# Patient Record
Sex: Male | Born: 1955 | Race: Black or African American | Hispanic: No | Marital: Single | Smoking: Current every day smoker
Health system: Southern US, Community
[De-identification: ages and names within clinical notes are randomized; demographics above are authoritative.]

## PROBLEM LIST (undated history)

## (undated) DIAGNOSIS — R200 Anesthesia of skin: Secondary | ICD-10-CM

## (undated) DIAGNOSIS — K5790 Diverticulosis of intestine, part unspecified, without perforation or abscess without bleeding: Secondary | ICD-10-CM

## (undated) DIAGNOSIS — M1990 Unspecified osteoarthritis, unspecified site: Secondary | ICD-10-CM

## (undated) DIAGNOSIS — G8929 Other chronic pain: Secondary | ICD-10-CM

## (undated) DIAGNOSIS — M62838 Other muscle spasm: Secondary | ICD-10-CM

## (undated) DIAGNOSIS — M542 Cervicalgia: Secondary | ICD-10-CM

## (undated) DIAGNOSIS — Z86010 Personal history of colonic polyps: Principal | ICD-10-CM

## (undated) DIAGNOSIS — Z8709 Personal history of other diseases of the respiratory system: Secondary | ICD-10-CM

## (undated) DIAGNOSIS — T7840XA Allergy, unspecified, initial encounter: Secondary | ICD-10-CM

## (undated) DIAGNOSIS — IMO0001 Reserved for inherently not codable concepts without codable children: Secondary | ICD-10-CM

## (undated) DIAGNOSIS — J449 Chronic obstructive pulmonary disease, unspecified: Secondary | ICD-10-CM

## (undated) DIAGNOSIS — W3400XA Accidental discharge from unspecified firearms or gun, initial encounter: Secondary | ICD-10-CM

## (undated) HISTORY — DX: Anesthesia of skin: R20.0

## (undated) HISTORY — DX: Unspecified osteoarthritis, unspecified site: M19.90

## (undated) HISTORY — DX: Allergy, unspecified, initial encounter: T78.40XA

## (undated) HISTORY — PX: TOENAIL EXCISION: SHX183

## (undated) HISTORY — PX: COLONOSCOPY: SHX174

## (undated) HISTORY — DX: Other muscle spasm: M62.838

## (undated) HISTORY — DX: Personal history of colonic polyps: Z86.010

---

## 2004-09-15 ENCOUNTER — Emergency Department (HOSPITAL_COMMUNITY): Admission: EM | Admit: 2004-09-15 | Discharge: 2004-09-15 | Payer: Self-pay | Admitting: Emergency Medicine

## 2004-09-17 ENCOUNTER — Ambulatory Visit: Payer: Self-pay | Admitting: Internal Medicine

## 2004-09-26 ENCOUNTER — Ambulatory Visit (HOSPITAL_COMMUNITY): Admission: RE | Admit: 2004-09-26 | Discharge: 2004-09-26 | Payer: Self-pay | Admitting: Internal Medicine

## 2004-09-26 ENCOUNTER — Ambulatory Visit: Payer: Self-pay | Admitting: Internal Medicine

## 2005-11-07 ENCOUNTER — Emergency Department (HOSPITAL_COMMUNITY): Admission: EM | Admit: 2005-11-07 | Discharge: 2005-11-08 | Payer: Self-pay | Admitting: Emergency Medicine

## 2005-11-24 ENCOUNTER — Ambulatory Visit: Payer: Self-pay | Admitting: *Deleted

## 2005-11-24 ENCOUNTER — Ambulatory Visit: Payer: Self-pay | Admitting: Family Medicine

## 2005-11-30 ENCOUNTER — Ambulatory Visit (HOSPITAL_COMMUNITY): Admission: RE | Admit: 2005-11-30 | Discharge: 2005-11-30 | Payer: Self-pay | Admitting: Internal Medicine

## 2005-12-02 ENCOUNTER — Ambulatory Visit: Payer: Self-pay | Admitting: Family Medicine

## 2005-12-03 ENCOUNTER — Ambulatory Visit (HOSPITAL_COMMUNITY): Admission: RE | Admit: 2005-12-03 | Discharge: 2005-12-03 | Payer: Self-pay | Admitting: Internal Medicine

## 2006-03-05 ENCOUNTER — Ambulatory Visit: Payer: Self-pay | Admitting: Family Medicine

## 2011-03-13 ENCOUNTER — Observation Stay (HOSPITAL_COMMUNITY)
Admission: EM | Admit: 2011-03-13 | Discharge: 2011-03-14 | Discharge status: Against Medical Advice | Payer: Self-pay | Attending: Internal Medicine | Admitting: Internal Medicine

## 2011-03-13 ENCOUNTER — Emergency Department (HOSPITAL_COMMUNITY): Payer: Self-pay

## 2011-03-13 DIAGNOSIS — F1010 Alcohol abuse, uncomplicated: Secondary | ICD-10-CM | POA: Insufficient documentation

## 2011-03-13 DIAGNOSIS — G8929 Other chronic pain: Secondary | ICD-10-CM | POA: Insufficient documentation

## 2011-03-13 DIAGNOSIS — F17200 Nicotine dependence, unspecified, uncomplicated: Secondary | ICD-10-CM | POA: Insufficient documentation

## 2011-03-13 DIAGNOSIS — R0602 Shortness of breath: Secondary | ICD-10-CM | POA: Insufficient documentation

## 2011-03-13 DIAGNOSIS — R079 Chest pain, unspecified: Secondary | ICD-10-CM | POA: Insufficient documentation

## 2011-03-13 DIAGNOSIS — M549 Dorsalgia, unspecified: Secondary | ICD-10-CM | POA: Insufficient documentation

## 2011-03-13 DIAGNOSIS — H538 Other visual disturbances: Secondary | ICD-10-CM | POA: Insufficient documentation

## 2011-03-13 DIAGNOSIS — Z8249 Family history of ischemic heart disease and other diseases of the circulatory system: Secondary | ICD-10-CM | POA: Insufficient documentation

## 2011-03-13 DIAGNOSIS — M79609 Pain in unspecified limb: Principal | ICD-10-CM | POA: Insufficient documentation

## 2011-03-13 DIAGNOSIS — E860 Dehydration: Secondary | ICD-10-CM | POA: Insufficient documentation

## 2011-03-13 DIAGNOSIS — R5381 Other malaise: Secondary | ICD-10-CM | POA: Insufficient documentation

## 2011-03-13 LAB — CARDIAC PANEL(CRET KIN+CKTOT+MB+TROPI)
CK, MB: 2.7 ng/mL (ref 0.3–4.0)
Troponin I: <0.3 ng/mL (ref <0.30)

## 2011-03-13 LAB — CBC
Hemoglobin: 15.2 g/dL (ref 13.0–17.0)
RBC: 4.72 MIL/uL (ref 4.22–5.81)
RDW: 12.4 % (ref 11.5–15.5)

## 2011-03-13 LAB — DIFFERENTIAL
Basophils Absolute: 0 10*3/uL (ref 0.0–0.1)
Basophils Relative: 0 % (ref 0–1)
Eosinophils Absolute: 0.1 10*3/uL (ref 0.0–0.7)
Neutro Abs: 6.6 10*3/uL (ref 1.7–7.7)
Neutrophils Relative %: 64 % (ref 43–77)

## 2011-03-13 LAB — BASIC METABOLIC PANEL
BUN: 22 mg/dL (ref 6–23)
Chloride: 105 mEq/L (ref 96–112)
GFR calc Af Amer: >60 mL/min (ref >60)
Potassium: 4.1 mEq/L (ref 3.5–5.1)

## 2011-03-13 LAB — CK TOTAL AND CKMB (NOT AT ARMC): Relative Index: 1.4 (ref 0.0–2.5)

## 2011-03-13 LAB — URINALYSIS, ROUTINE W REFLEX MICROSCOPIC
Bilirubin Urine: NEGATIVE
Leukocytes, UA: NEGATIVE
Nitrite: NEGATIVE
Specific Gravity, Urine: 1.033 — ABNORMAL HIGH (ref 1.005–1.030)
pH: 5.5 (ref 5.0–8.0)

## 2011-03-14 LAB — CBC
HCT: 42.2 % (ref 39.0–52.0)
Hemoglobin: 14 g/dL (ref 13.0–17.0)
MCH: 31.3 pg (ref 26.0–34.0)
MCHC: 33.2 g/dL (ref 30.0–36.0)
MCV: 94.4 fL (ref 78.0–100.0)

## 2011-03-14 LAB — LIPID PANEL
HDL: 68 mg/dL (ref >39)
LDL Cholesterol: 28 mg/dL (ref 0–99)
Triglycerides: 123 mg/dL (ref <150)
VLDL: 25 mg/dL (ref 0–40)

## 2011-03-14 LAB — BASIC METABOLIC PANEL
CO2: 24 mEq/L (ref 19–32)
Glucose, Bld: 96 mg/dL (ref 70–99)
Potassium: 3.8 mEq/L (ref 3.5–5.1)
Sodium: 139 mEq/L (ref 135–145)

## 2011-03-14 LAB — URINE DRUGS OF ABUSE SCREEN W ALC, ROUTINE (REF LAB)
Benzodiazepines.: POSITIVE — AB
Cocaine Metabolites: POSITIVE — AB
Creatinine,U: 305.9 mg/dL
Ethyl Alcohol: <10 mg/dL (ref <10)
Methadone: NEGATIVE
Phencyclidine (PCP): NEGATIVE

## 2011-03-19 LAB — BENZODIAZEPINE, QUANTITATIVE, URINE
Flurazepam GC/MS Conf: NEGATIVE NG/ML
Temazepam GC/MS Conf: NEGATIVE NG/ML

## 2011-03-19 LAB — OPIATE, QUANTITATIVE, URINE
Codeine Urine: NEGATIVE NG/ML
Hydrocodone: NEGATIVE NG/ML
Hydromorphone GC/MS Conf: NEGATIVE NG/ML
Oxycodone, ur: NEGATIVE NG/ML
Oxymorphone: NEGATIVE NG/ML

## 2011-03-31 NOTE — Discharge Summary (Signed)
  NAMELOWRY, Mark NO.:  Crosby  MEDICAL RECORD NO.:  [id number]  LOCATION:  3738                         FACILITY:  [facility]  PHYSICIAN:  Lonia Blood, M.D.       DATE OF BIRTH:  [birth year]  DATE OF ADMISSION:  03/13/2011 DATE OF DISCHARGE:  03/14/2011                              DISCHARGE SUMMARY   PRIMARY CARE PHYSICIAN:  This patient does not have a primary care physician.  DISCHARGE DIAGNOSES: 1. Left arm pain. 2. Tobacco abuse. 3. Alcohol abuse. 4. Mild dehydration. 5. Chronic back pain.  DISCHARGE MEDICATIONS:  The patient left against medical advice and on discharge medications could be prescribed.  CONDITION ON DISCHARGE:  The patient eloped the hospital, so I assume he was in walking condition, but otherwise I have never met him, so I do not know.  HISTORY AND PHYSICAL:  Refer to dictated H and P done by Dr. Kerry Hough.  HOSPITAL COURSE:  Mark Crosby is a 55 year old gentleman with tobacco abuse who was admitted for symptoms that were interpreted as anginal equivalent.  The patient stayed overnight in the hospital and had cardiac panels x3 within normal limits.  On the morning of March 14, 2011, at 9 a.m. as I was making rounds and trying to make my way to see Mark Crosby, I actually ran into him as he was leaving the hospital and basically eloping.  He would not seat down to have any conversations with me and talking to the nurse at the bedside.  The nurse told me that the patient declared that he is feeling fine and he does not have any patience to wait for anyone anymore and he left the hospital.     Lonia Blood, M.D.     SL/MEDQ  D:  03/16/2011  T:  03/17/2011  Job:  [id number]  Electronically Signed by Lonia Blood M.D. on 03/31/2011 05:36:48 PM

## 2011-03-31 NOTE — H&P (Signed)
Mark Crosby, SPRINGBORN NO.:  [id number]  MEDICAL RECORD NO.:  [id number]  LOCATION:  3738                         FACILITY:  [facility]  PHYSICIAN:  Erick Blinks, MD     DATE OF BIRTH:  1955-11-26  DATE OF ADMISSION:  03/13/2011 DATE OF DISCHARGE:                             HISTORY & PHYSICAL   PRIMARY CARE PHYSICIAN:  The patient does not have primary care physician.  CHIEF COMPLAINT:  Left arm pain.  HISTORY OF PRESENT ILLNESS:  This is a 55 year old African American male who has never really been to see a doctor and does not have any known medical problems.  The patient was in his usual state of health when yesterday he noted that while he was cutting the grass he started to become short of breath and started to have some cramping in his left arm.  The patient rested after that and noticed improvement in his symptoms.  He also describes some associated diaphoresis.  Later on the day, the patient went ahead and started to move some heavy furniture and did not describe any complaints with that.  This morning, the patient was again working out in the sun with his weed walker and noticed onset of shortness of breath as well as left arm cramping.  He also noticed some numbness in the tips of his fingers.  The patient was felt generally weak and lightheaded and was brought to the emergency room for evaluation.  He describes some associated diaphoresis as well as again shortness of breath.  When he arrived here in the emergency room, he received some fluids and some aspirin and morphine and describes improvement of his symptoms with those measures.  The patient does not have any history of documented coronary artery disease.  He has not really been to see a doctor.  He does smoke a pipe on a daily basis and also drinks alcohol regularly.  He reports having a positive family history with his mother having a heart attack at the age of 13 and his sister had recently  passed away in her late 19s from having a stroke. The patient will be admitted for further observation.  PAST MEDICAL HISTORY:  The patient has had pneumonia in the past, otherwise has some chronic back pain.  No other significant past medical history.  ALLERGIES:  No known drug allergies.  MEDICATIONS PRIOR TO ADMISSION: 1. Multivitamin. 2. Aleve  SOCIAL HISTORY:  The patient smokes a pipe per day.  He also drinks liquor as well as beer, he says a pint of vodka will last him 2-3 days. Denies any history of alcohol withdrawal.  Denies any illicit drug use.  FAMILY HISTORY:  The patient's sister recently died of a stroke in her late 59s.  His mother had a MI at the age of 61.  REVIEW OF SYSTEMS:  All systems are reviewed and pertinent positives as stated in the HPI, otherwise negative.  PHYSICAL EXAMINATION:  VITAL SIGNS:  Blood pressure 135/93, heart rate of 95, respirations 18, temperature 98.7, and pulse ox 96% on room air. GENERAL:  The patient is in no acute distress, lying comfortably in bed. HEENT:  Normocephalic and  atraumatic.  Pupils are equal, round, and reactive to light. NECK:  Supple. CHEST:  Clear to auscultation bilaterally. CARDIAC:  S1 and S2 with regular rate and rhythm. ABDOMEN:  Soft and nontender.  Bowel sounds are active. EXTREMITIES:  No signs of cyanosis, clubbing, or edema. NEUROLOGICAL:  The patient has 5/5 strength bilaterally.  Cranial nerves II-XII grossly intact.  There is no facial symmetry.  LABORATORY DATA:  WBC 10.4, hemoglobin 15.2, and platelet count of 254. Cardiac enzymes are negative x1.  Basic metabolic panel shows a sodium of 141, potassium 4.1, chloride 105, bicarb 23, glucose of 94, BUN 22, creatinine 1.15, and calcium 10.1.  CT of head was a normal head CT.  Chest x-ray shows no worrisome focal or acute cardiopulmonary abnormality.  EKG has been reviewed and does not show any acute ST-T changes.  ASSESSMENT/PLAN: 1. Atypical  symptoms of possible stable angina, rule out myocardial     infarction. 2. Tobacco abuse. 3. Alcohol abuse. 4. Dehydration. 5. Chronic back pain. 6. Full code.  PLAN:  We will admit the patient for observation to a telemetry unit, cycle his cardiac markers, check a fasting lipid panel, hemoglobin A1c, and a 2-D echocardiogram.  We will also check an alcohol level and urine drug screen as well as TSH.  We will repeat an EKG in the morning, give him IV fluids and pain management.  I suspect that most of his symptoms are related to dehydration.  If the patient does not have any further symptoms and his workup is negative, an outpatient stress test may be appropriate.  Further orders will per the clinical course.     Erick Blinks, MD     JM/MEDQ  D:  03/13/2011  T:  03/14/2011  Job:  [id number]  Electronically Signed by Durward Mallard Sania Noy  on 03/31/2011 05:18:42 PM

## 2014-08-28 ENCOUNTER — Ambulatory Visit (INDEPENDENT_AMBULATORY_CARE_PROVIDER_SITE_OTHER): Payer: 59 | Admitting: Family Medicine

## 2014-08-28 ENCOUNTER — Ambulatory Visit (INDEPENDENT_AMBULATORY_CARE_PROVIDER_SITE_OTHER): Payer: 59

## 2014-08-28 ENCOUNTER — Encounter: Payer: Self-pay | Admitting: Family Medicine

## 2014-08-28 VITALS — BP 120/70 | HR 99 | Temp 98.4°F | Resp 16 | Ht 66.25 in | Wt 158.0 lb

## 2014-08-28 DIAGNOSIS — M79601 Pain in right arm: Secondary | ICD-10-CM

## 2014-08-28 DIAGNOSIS — G8929 Other chronic pain: Secondary | ICD-10-CM

## 2014-08-28 DIAGNOSIS — R208 Other disturbances of skin sensation: Secondary | ICD-10-CM

## 2014-08-28 DIAGNOSIS — J42 Unspecified chronic bronchitis: Secondary | ICD-10-CM

## 2014-08-28 DIAGNOSIS — M79602 Pain in left arm: Secondary | ICD-10-CM

## 2014-08-28 DIAGNOSIS — M542 Cervicalgia: Secondary | ICD-10-CM | POA: Insufficient documentation

## 2014-08-28 DIAGNOSIS — B360 Pityriasis versicolor: Secondary | ICD-10-CM

## 2014-08-28 DIAGNOSIS — Z1212 Encounter for screening for malignant neoplasm of rectum: Secondary | ICD-10-CM

## 2014-08-28 DIAGNOSIS — F1911 Other psychoactive substance abuse, in remission: Secondary | ICD-10-CM | POA: Insufficient documentation

## 2014-08-28 DIAGNOSIS — Z23 Encounter for immunization: Secondary | ICD-10-CM

## 2014-08-28 DIAGNOSIS — Z8701 Personal history of pneumonia (recurrent): Secondary | ICD-10-CM | POA: Insufficient documentation

## 2014-08-28 DIAGNOSIS — R200 Anesthesia of skin: Secondary | ICD-10-CM

## 2014-08-28 DIAGNOSIS — Z1211 Encounter for screening for malignant neoplasm of colon: Secondary | ICD-10-CM

## 2014-08-28 MED ORDER — FLUCONAZOLE 150 MG PO TABS: ORAL_TABLET | ORAL | Status: DC

## 2014-08-28 MED ORDER — MELOXICAM 7.5 MG PO TABS: ORAL_TABLET | ORAL | Status: DC

## 2014-08-28 MED ORDER — GABAPENTIN 100 MG PO CAPS: 100.0000 mg | ORAL_CAPSULE | Freq: Three times a day (TID) | ORAL | Status: DC
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
## 2014-08-28 NOTE — Patient Instructions (Addendum)
You last had colonoscopy in 2006, performed by Dr. Silvano Rusk. You are due for repeat procedure this year. I am going to order GI referral; our office will contact Dr. Celesta Aver office and get an appointment for you to go for your initial consult. Dr. Celesta Aver office number is:  [phone].    Your neck xrays show early disc disease which could mean that you have a pinched nerve in your neck.  I have prescribed some medications for pain and numbness. Take the medication daily and I will see you again in 6 weeks for follow-up and a complete physical exam along with lab tests.  If your symptoms get worse, contact the clinic or walk-in at [facility] which is next door.

## 2014-08-29 ENCOUNTER — Encounter: Payer: Self-pay | Admitting: Internal Medicine

## 2014-08-30 ENCOUNTER — Encounter: Payer: Self-pay | Admitting: Internal Medicine

## 2014-09-04 ENCOUNTER — Telehealth: Payer: Self-pay

## 2014-09-04 NOTE — Telephone Encounter (Signed)
The medicine pt was given isn't helping and would like to have something called in for him. Please call pt at [phone]    [facility] [address] IN [place]

## 2014-09-05 NOTE — Telephone Encounter (Signed)
Pt says he refuses to take medication. He says he eats well and knows when something is not right with his "stomach." He says he is in a lot of pain, but will not take the prescribed mediation because "it makes him bloated."  I told him if he is still having continued or worsening pain then he needs to RTC for a re-check.

## 2014-09-05 NOTE — Telephone Encounter (Signed)
Referral to GI specialist has been authorized. If he feels like he needs to be evaluated before he sees the GI physician, he can walk in at [address] or schedule as re-check with me.

## 2014-09-05 NOTE — Telephone Encounter (Signed)
Spoke with pt. He says that his pain is not any better at all and now he is bloated and constipated. Please advise. Thanks

## 2014-09-05 NOTE — Telephone Encounter (Addendum)
Please inform pt that he has not been taking the medication long enough to see benefit. He needs to  Continue taking medications as prescribed. For bloating and constipation- Is he consuming adequate fiber? Reduce amount of rice, bread and other starchy foods that he consumes. He can get an over-the-counter Benefiber product or ask his pharmacist at [facility] what would be best for relief of GI symptoms.

## 2014-09-06 NOTE — Telephone Encounter (Signed)
Pt advised.

## 2014-10-12 ENCOUNTER — Encounter: Payer: Self-pay | Admitting: Internal Medicine

## 2014-11-01 ENCOUNTER — Emergency Department (HOSPITAL_COMMUNITY)
Admission: EM | Admit: 2014-11-01 | Discharge: 2014-11-01 | Disposition: A | Discharge status: Home / Self Care | Payer: 59 | Attending: Emergency Medicine | Admitting: Emergency Medicine

## 2014-11-01 ENCOUNTER — Emergency Department (HOSPITAL_COMMUNITY): Payer: 59

## 2014-11-01 ENCOUNTER — Encounter: Payer: Self-pay | Admitting: Internal Medicine

## 2014-11-01 ENCOUNTER — Encounter (HOSPITAL_COMMUNITY): Payer: Self-pay | Admitting: Emergency Medicine

## 2014-11-01 DIAGNOSIS — Z791 Long term (current) use of non-steroidal anti-inflammatories (NSAID): Secondary | ICD-10-CM | POA: Diagnosis not present

## 2014-11-01 DIAGNOSIS — H53149 Visual discomfort, unspecified: Secondary | ICD-10-CM | POA: Diagnosis not present

## 2014-11-01 DIAGNOSIS — R059 Cough, unspecified: Secondary | ICD-10-CM

## 2014-11-01 DIAGNOSIS — Z720 Tobacco use: Secondary | ICD-10-CM | POA: Insufficient documentation

## 2014-11-01 DIAGNOSIS — M1990 Unspecified osteoarthritis, unspecified site: Secondary | ICD-10-CM | POA: Diagnosis not present

## 2014-11-01 DIAGNOSIS — B349 Viral infection, unspecified: Secondary | ICD-10-CM | POA: Diagnosis not present

## 2014-11-01 DIAGNOSIS — R05 Cough: Secondary | ICD-10-CM | POA: Diagnosis present

## 2014-11-01 DIAGNOSIS — Z79899 Other long term (current) drug therapy: Secondary | ICD-10-CM | POA: Diagnosis not present

## 2014-11-01 NOTE — ED Provider Notes (Signed)
CSN: [id number]     Arrival date & time 11/01/14  0935 History  This chart was scribed for non-physician practitioner Glendell Docker, PA-C working with Charlesetta Shanks, MD, by Eustaquio Maize, ED Scribe. This patient was seen in room TR11C/TR11C and the patient's care was started at 9:55 AM.    Chief Complaint  Patient presents with  . URI   The history is provided by the patient. No language interpreter was used.     HPI Comments: Mark Crosby is a 59 y.o. male who presents to the Emergency Department complaining of chest discomfort, cough, chills, generalized weakness, and photophobia that began approximately 2 days ago. Pt reports similar symptoms in the past which turned out to be Bronchitis. He denies shortness of breath, diaphoresis, or any other symptoms. Pt admits to daily cigarette use.     Past Medical History  Diagnosis Date  . Allergy   . Arthritis    History reviewed. No pertinent past surgical history. Family History  Problem Relation Age of Onset  . Heart disease Mother     MI  . Stroke Sister   . Lupus Sister    History  Substance Use Topics  . Smoking status: Current Some Day Smoker -- 47 years    Types: Pipe  . Smokeless tobacco: Not on file  . Alcohol Use: 0.0 oz/week    0 Standard drinks or equivalent per week     Comment: Occasional    Review of Systems  Constitutional: Positive for chills and fatigue. Negative for diaphoresis.  Eyes: Positive for photophobia.  Respiratory: Positive for cough. Negative for shortness of breath.   Cardiovascular: Negative for chest pain.       Chest discomfort.   All other systems reviewed and are negative.     Allergies  Percocet  Home Medications   Prior to Admission medications   Medication Sig Start Date End Date Taking? Authorizing Provider  fluconazole (DIFLUCAN) 150 MG tablet Take 2 tablets once a week for 2 weeks. 08/28/14   Barton Fanny, MD  gabapentin (NEURONTIN) 100 MG capsule Take 1  capsule (100 mg total) by mouth 3 (three) times daily. 08/28/14   Barton Fanny, MD  meloxicam (MOBIC) 7.5 MG tablet Take 1 tablet by mouth twice a day with meal or snack. 08/28/14   Barton Fanny, MD   Triage Vitals: BP 128/80 mmHg  Pulse 106  Temp(Src) 99 F (37.2 C) (Oral)  Resp 19  Ht 5\' 7"  (1.702 m)  Wt 165 lb (74.844 kg)  BMI 25.84 kg/m2  SpO2 99%   Physical Exam  Constitutional: He is oriented to person, place, and time. He appears well-developed and well-nourished. No distress.  HENT:  Head: Normocephalic and atraumatic.  Left Ear: External ear normal.  Nose: Rhinorrhea present.  Mouth/Throat: Posterior oropharyngeal erythema present.  Eyes: Conjunctivae and EOM are normal.  Neck: Neck supple. No tracheal deviation present.  Cardiovascular: Normal rate.   Pulmonary/Chest: Effort normal. No respiratory distress.  Musculoskeletal: Normal range of motion.  Neurological: He is alert and oriented to person, place, and time.  Skin: Skin is warm and dry.  Psychiatric: He has a normal mood and affect. His behavior is normal.  Nursing note and vitals reviewed.   ED Course  Procedures (including critical care time)  DIAGNOSTIC STUDIES: Oxygen Saturation is 99% on RA, normal by my interpretation.    COORDINATION OF CARE: 9:58 AM-Discussed treatment plan which includes CXR with pt at bedside and pt  agreed to plan.   Labs Review Labs Reviewed - No data to display  Imaging Review Dg Chest 2 View  11/01/2014   CLINICAL DATA:  59 year old with chest pain and shortness of breath for 1 week.  EXAM: CHEST  2 VIEW  COMPARISON:  08/18/2014  FINDINGS: The heart size and mediastinal contours are within normal limits. Both lungs are clear. The visualized skeletal structures are unremarkable.  IMPRESSION: No active cardiopulmonary disease.   Electronically Signed   By: Markus Daft M.D.   On: 11/01/2014 10:17     EKG Interpretation None      MDM   Final diagnoses:   Cough  Viral illness   No pneumonia noted on X-RAY.discussed supportive care with pt  I personally performed the services described in this documentation, which was scribed in my presence. The recorded information has been reviewed and is accurate.     Glendell Docker, NP 11/01/14 [place], MD 11/03/14 [phone]

## 2014-11-01 NOTE — Discharge Instructions (Signed)

## 2014-11-01 NOTE — ED Notes (Signed)
Declined W/C at D/C and was escorted to lobby by RN. 

## 2014-11-01 NOTE — ED Notes (Signed)
Pt c/o URI sx with body aches and cough; pt sts recent bronchitis

## 2014-11-04 ENCOUNTER — Inpatient Hospital Stay (HOSPITAL_COMMUNITY)
Admission: EM | Admit: 2014-11-04 | Discharge: 2014-11-07 | DRG: 190 | Disposition: A | Discharge status: Home / Self Care | Payer: 59 | Attending: Internal Medicine | Admitting: Internal Medicine

## 2014-11-04 ENCOUNTER — Emergency Department (HOSPITAL_COMMUNITY): Payer: 59

## 2014-11-04 ENCOUNTER — Encounter (HOSPITAL_COMMUNITY): Payer: Self-pay | Admitting: Emergency Medicine

## 2014-11-04 DIAGNOSIS — Z885 Allergy status to narcotic agent status: Secondary | ICD-10-CM

## 2014-11-04 DIAGNOSIS — M549 Dorsalgia, unspecified: Secondary | ICD-10-CM | POA: Diagnosis present

## 2014-11-04 DIAGNOSIS — R9431 Abnormal electrocardiogram [ECG] [EKG]: Secondary | ICD-10-CM | POA: Diagnosis present

## 2014-11-04 DIAGNOSIS — J9600 Acute respiratory failure, unspecified whether with hypoxia or hypercapnia: Secondary | ICD-10-CM | POA: Diagnosis present

## 2014-11-04 DIAGNOSIS — R0600 Dyspnea, unspecified: Secondary | ICD-10-CM | POA: Diagnosis not present

## 2014-11-04 DIAGNOSIS — F17210 Nicotine dependence, cigarettes, uncomplicated: Secondary | ICD-10-CM | POA: Diagnosis present

## 2014-11-04 DIAGNOSIS — B349 Viral infection, unspecified: Secondary | ICD-10-CM | POA: Diagnosis present

## 2014-11-04 DIAGNOSIS — J449 Chronic obstructive pulmonary disease, unspecified: Secondary | ICD-10-CM

## 2014-11-04 DIAGNOSIS — Z720 Tobacco use: Secondary | ICD-10-CM | POA: Diagnosis not present

## 2014-11-04 DIAGNOSIS — M1990 Unspecified osteoarthritis, unspecified site: Secondary | ICD-10-CM | POA: Diagnosis present

## 2014-11-04 DIAGNOSIS — Z79899 Other long term (current) drug therapy: Secondary | ICD-10-CM | POA: Diagnosis not present

## 2014-11-04 DIAGNOSIS — G8929 Other chronic pain: Secondary | ICD-10-CM | POA: Diagnosis present

## 2014-11-04 DIAGNOSIS — R0602 Shortness of breath: Secondary | ICD-10-CM

## 2014-11-04 DIAGNOSIS — J101 Influenza due to other identified influenza virus with other respiratory manifestations: Secondary | ICD-10-CM | POA: Diagnosis present

## 2014-11-04 DIAGNOSIS — J441 Chronic obstructive pulmonary disease with (acute) exacerbation: Principal | ICD-10-CM | POA: Diagnosis present

## 2014-11-04 HISTORY — DX: Chronic obstructive pulmonary disease, unspecified: J44.9

## 2014-11-04 HISTORY — DX: Reserved for inherently not codable concepts without codable children: IMO0001

## 2014-11-04 LAB — I-STAT CHEM 8, ED
BUN: 15 mg/dL (ref 6–23)
CHLORIDE: 98 mmol/L (ref 96–112)
Calcium, Ion: 1.09 mmol/L — ABNORMAL LOW (ref 1.12–1.23)
Creatinine, Ser: 1.3 mg/dL (ref 0.50–1.35)
Glucose, Bld: 114 mg/dL — ABNORMAL HIGH (ref 70–99)
HEMATOCRIT: 51 % (ref 39.0–52.0)
Hemoglobin: 17.3 g/dL — ABNORMAL HIGH (ref 13.0–17.0)
Potassium: 4.1 mmol/L (ref 3.5–5.1)
Sodium: 137 mmol/L (ref 135–145)
TCO2: 27 mmol/L (ref 0–100)

## 2014-11-04 LAB — CBC WITH DIFFERENTIAL/PLATELET
Basophils Absolute: 0 10*3/uL (ref 0.0–0.1)
Basophils Relative: 0 % (ref 0–1)
EOS PCT: 0 % (ref 0–5)
Eosinophils Absolute: 0 10*3/uL (ref 0.0–0.7)
HEMATOCRIT: 45.7 % (ref 39.0–52.0)
Hemoglobin: 15.5 g/dL (ref 13.0–17.0)
LYMPHS ABS: 2 10*3/uL (ref 0.7–4.0)
LYMPHS PCT: 20 % (ref 12–46)
MCH: 33 pg (ref 26.0–34.0)
MCHC: 33.9 g/dL (ref 30.0–36.0)
MCV: 97.2 fL (ref 78.0–100.0)
Monocytes Absolute: 1.6 10*3/uL — ABNORMAL HIGH (ref 0.1–1.0)
Monocytes Relative: 17 % — ABNORMAL HIGH (ref 3–12)
Neutro Abs: 6.2 10*3/uL (ref 1.7–7.7)
Neutrophils Relative %: 63 % (ref 43–77)
Platelets: 207 10*3/uL (ref 150–400)
RBC: 4.7 MIL/uL (ref 4.22–5.81)
RDW: 12 % (ref 11.5–15.5)
WBC: 9.8 10*3/uL (ref 4.0–10.5)

## 2014-11-04 LAB — I-STAT TROPONIN, ED: TROPONIN I, POC: 0.03 ng/mL (ref 0.00–0.08)

## 2014-11-04 LAB — D-DIMER, QUANTITATIVE: D-Dimer, Quant: 0.56 ug/mL-FEU — ABNORMAL HIGH (ref 0.00–0.48)

## 2014-11-04 MED ORDER — SODIUM CHLORIDE 0.9 % IV BOLUS (SEPSIS)
1000.0000 mL | Freq: Once | INTRAVENOUS | Status: AC
  Administered 2014-11-04: 1000 mL via INTRAVENOUS

## 2014-11-04 MED ORDER — LORAZEPAM 2 MG/ML IJ SOLN
1.0000 mg | Freq: Once | INTRAMUSCULAR | Status: AC
  Administered 2014-11-04: 1 mg via INTRAVENOUS
  Filled 2014-11-04: qty 1

## 2014-11-04 MED ORDER — ACETAMINOPHEN 500 MG PO TABS
1000.0000 mg | ORAL_TABLET | Freq: Once | ORAL | Status: AC
  Administered 2014-11-04: 1000 mg via ORAL
  Filled 2014-11-04: qty 2

## 2014-11-04 MED ORDER — METHYLPREDNISOLONE SODIUM SUCC 125 MG IJ SOLR
125.0000 mg | Freq: Once | INTRAMUSCULAR | Status: AC
Start: 2014-11-04 — End: 2014-11-04
  Administered 2014-11-04: 125 mg via INTRAVENOUS
  Filled 2014-11-04: qty 2

## 2014-11-04 MED ORDER — IPRATROPIUM-ALBUTEROL 0.5-2.5 (3) MG/3ML IN SOLN
3.0000 mL | Freq: Once | RESPIRATORY_TRACT | Status: AC
  Administered 2014-11-04: 3 mL via RESPIRATORY_TRACT
  Filled 2014-11-04: qty 3

## 2014-11-04 MED ORDER — IOHEXOL 350 MG/ML SOLN
75.0000 mL | Freq: Once | INTRAVENOUS | Status: AC | PRN
  Administered 2014-11-04: 75 mL via INTRAVENOUS

## 2014-11-04 NOTE — ED Provider Notes (Signed)
CSN: [id number]     Arrival date & time 11/04/14  1800 History   First MD Initiated Contact with Patient 11/04/14 2008     Chief Complaint  Patient presents with  . Shortness of Breath     (Consider location/radiation/quality/duration/timing/severity/associated sxs/prior Treatment) The history is provided by the patient.  Mark Crosby is a 59 y.o. male who presented with shortness of breath, productive cough. He has been having productive cough for the last 4-5 days. Came to the ER 3 days ago and was diagnosed with viral syndrome. He has been using his albuterol intermittently has the feeling better. Has some low-grade fevers at home as well. He is still smoking but was unable to smoke for the last several days. No history of COPD.   Past Medical History  Diagnosis Date  . Allergy   . Arthritis   . Cancer    History reviewed. No pertinent past surgical history. Family History  Problem Relation Age of Onset  . Heart disease Mother     MI  . Stroke Sister   . Lupus Sister    History  Substance Use Topics  . Smoking status: Current Some Day Smoker -- 47 years    Types: Pipe  . Smokeless tobacco: Not on file  . Alcohol Use: 0.0 oz/week    0 Standard drinks or equivalent per week     Comment: Occasional    Review of Systems  Respiratory: Positive for cough and shortness of breath.   All other systems reviewed and are negative.     Allergies  Percocet  Home Medications   Prior to Admission medications   Medication Sig Start Date End Date Taking? Authorizing Provider  albuterol (PROVENTIL HFA;VENTOLIN HFA) 108 (90 BASE) MCG/ACT inhaler Inhale 2 puffs into the lungs every 6 (six) hours as needed for wheezing or shortness of breath.   Yes Historical Provider, MD  guaiFENesin (ROBITUSSIN) 100 MG/5ML liquid Take 200 mg by mouth 3 (three) times daily as needed for cough.   Yes Historical Provider, MD  meloxicam (MOBIC) 7.5 MG tablet Take 1 tablet by mouth twice a day with  meal or snack. Patient taking differently: Take 7.5 mg by mouth 2 (two) times daily as needed for pain. Take with meal or snack. 08/28/14  Yes Barton Fanny, MD  Naproxen Sod-Diphenhydramine 220-25 MG TABS Take 1 tablet by mouth every hour as needed (pain). Aleve PM   Yes Historical Provider, MD  fluconazole (DIFLUCAN) 150 MG tablet Take 2 tablets once a week for 2 weeks. Patient not taking: Reported on 11/04/2014 08/28/14   Barton Fanny, MD  gabapentin (NEURONTIN) 100 MG capsule Take 1 capsule (100 mg total) by mouth 3 (three) times daily. Patient not taking: Reported on 11/04/2014 08/28/14   Barton Fanny, MD   BP 104/63 mmHg  Pulse 62  Temp(Src) 99.6 F (37.6 C) (Oral)  Resp 19  Ht 5\' 7"  (1.702 m)  Wt 160 lb (72.576 kg)  BMI 25.05 kg/m2  SpO2 99% Physical Exam  Constitutional: He is oriented to person, place, and time.  Coughing   HENT:  Head: Normocephalic.  Mouth/Throat: Oropharynx is clear and moist.  Eyes: Conjunctivae are normal. Pupils are equal, round, and reactive to light.  Neck: Normal range of motion. Neck supple.  Cardiovascular: Regular rhythm and normal heart sounds.   Mildly tachy   Pulmonary/Chest:  Slightly tachypneic, mild diffuse wheezing.   Abdominal: Soft. Bowel sounds are normal. He exhibits no distension. There  is no tenderness. There is no rebound.  Musculoskeletal: Normal range of motion. He exhibits no edema or tenderness.  Neurological: He is alert and oriented to person, place, and time. No cranial nerve deficit. Coordination normal.  Skin: Skin is dry.  Psychiatric: He has a normal mood and affect. His behavior is normal. Judgment and thought content normal.  Nursing note and vitals reviewed.   ED Course  Procedures (including critical care time) Labs Review Labs Reviewed  CBC WITH DIFFERENTIAL/PLATELET - Abnormal; Notable for the following:    Monocytes Relative 17 (*)    Monocytes Absolute 1.6 (*)    All other components  within normal limits  D-DIMER, QUANTITATIVE - Abnormal; Notable for the following:    D-Dimer, Quant 0.56 (*)    All other components within normal limits  I-STAT CHEM 8, ED - Abnormal; Notable for the following:    Glucose, Bld 114 (*)    Calcium, Ion 1.09 (*)    Hemoglobin 17.3 (*)    All other components within normal limits  I-STAT TROPOININ, ED    Imaging Review Dg Chest 2 View  11/04/2014   CLINICAL DATA:  Chest pain and cough for 1 month.  EXAM: CHEST  2 VIEW  COMPARISON:  11/01/2014 and prior chest radiographs.  FINDINGS: The cardiomediastinal silhouette is unremarkable.  Mild peribronchial thickening again noted.  There is no evidence of focal airspace disease, pulmonary edema, suspicious pulmonary nodule/mass, pleural effusion, or pneumothorax.  No acute bony abnormalities are identified.  IMPRESSION: No evidence of acute cardiopulmonary disease.  Mild chronic peribronchial thickening.   Electronically Signed   By: Margarette Canada M.D.   On: 11/04/2014 18:57     EKG Interpretation   Date/Time:  Sunday November 04 2014 18:21:51 EDT Ventricular Rate:  112 PR Interval:  116 QRS Duration: 78 QT Interval:  294 QTC Calculation: 401 R Axis:   75 Text Interpretation:  Sinus tachycardia with Premature atrial complexes  with Abberant conduction Right atrial enlargement Nonspecific ST  abnormality Abnormal QRS-T angle, consider primary T wave abnormality  Abnormal ECG tachycardia new  Confirmed by Allante Whitmire  MD, Meela Wareing ([id number]) on  11/04/2014 8:22:00 PM      MDM   Final diagnoses:  Shortness of breath    Mark Crosby is a 59 y.o. male here with cough, SOB, wheezing. Likely bronchitis vs COPD vs pneumonia. Will get labs, CXR. Will give nebs and reassess.   10 pm Patient still tachy and tachypneic despite duoneb. Will give steroids and fluids. Will check d-dimer.   11:30 PM D-dimer positive. CT ordered. Still tachycardic and tachypneic. Will admit for COPD exacerbation. Will check flu.       Wandra Arthurs, MD 11/04/14 [phone]

## 2014-11-04 NOTE — H&P (Addendum)
[facility] History and Physical  Mark Crosby WVP:[id number] DOB: 08/06/1956 DOA: 11/04/2014  Referring physician: EDP PCP: No primary care provider on file.   Chief Complaint: cough, shortness of breath  HPI: Mark Crosby is a 59 y.o. male with PMH of Tobacco abuse, chronic back pain presents to the ER with the above complaints. He reports feeling poorly for over 1 week now, symptoms started with cough, congestion, bodyaches, fevers and chills, he was seen in the ER 3days ago and was discharged after supportive care. Then felt bad again the next day with persistence of headaches, fevers, congestion, cough and bodyaches along with shortness of breath and wheezing. In ER, low grade temp, wheezing, CXR unremarkable, possible infectious process on CTA, no PE    Review of Systems: positives bolded Constitutional:  No weight loss, night sweats, Fevers, chills, fatigue.  HEENT:  No headaches, Difficulty swallowing,Tooth/dental problems,Sore throat,  No sneezing, itching, ear ache, nasal congestion, post nasal drip,  Cardio-vascular:  No chest pain, Orthopnea, PND, swelling in lower extremities, anasarca, dizziness, palpitations  GI:  No heartburn, indigestion, abdominal pain, nausea, vomiting, diarrhea, change in bowel habits, loss of appetite  Resp:   shortness of breath with exertion or at rest. No excess mucus, productive cough, No non-productive cough, No coughing up of blood.No change in color of mucus.No wheezing.No chest wall deformity  Skin:  no rash or lesions.  GU:  no dysuria, change in color of urine, no urgency or frequency. No flank pain.  Musculoskeletal:  No joint pain or swelling. No decreased range of motion. No back pain.  Psych:  No change in mood or affect. No depression or anxiety. No memory loss.   Past Medical History  Diagnosis Date  . Allergy   . Arthritis   . Cancer    History reviewed. No pertinent past surgical history. Social History:   reports that he has been smoking Pipe.  He does not have any smokeless tobacco history on file. He reports that he drinks alcohol. He reports that he does not use illicit drugs.  Allergies  Allergen Reactions  . Percocet [Oxycodone-Acetaminophen] Itching    Family History  Problem Relation Age of Onset  . Heart disease Mother     MI  . Stroke Sister   . Lupus Sister     Prior to Admission medications   Medication Sig Start Date End Date Taking? Authorizing Provider  albuterol (PROVENTIL HFA;VENTOLIN HFA) 108 (90 BASE) MCG/ACT inhaler Inhale 2 puffs into the lungs every 6 (six) hours as needed for wheezing or shortness of breath.   Yes Historical Provider, MD  guaiFENesin (ROBITUSSIN) 100 MG/5ML liquid Take 200 mg by mouth 3 (three) times daily as needed for cough.   Yes Historical Provider, MD  meloxicam (MOBIC) 7.5 MG tablet Take 1 tablet by mouth twice a day with meal or snack. Patient taking differently: Take 7.5 mg by mouth 2 (two) times daily as needed for pain. Take with meal or snack. 08/28/14  Yes Barton Fanny, MD  Naproxen Sod-Diphenhydramine 220-25 MG TABS Take 1 tablet by mouth every hour as needed (pain). Aleve PM   Yes Historical Provider, MD  fluconazole (DIFLUCAN) 150 MG tablet Take 2 tablets once a week for 2 weeks. Patient not taking: Reported on 11/04/2014 08/28/14   Barton Fanny, MD  gabapentin (NEURONTIN) 100 MG capsule Take 1 capsule (100 mg total) by mouth 3 (three) times daily. Patient not taking: Reported on 11/04/2014 08/28/14   Pamala Hurry  Doneen Poisson, MD   Physical Exam: Filed Vitals:   11/04/14 2145 11/04/14 2200 11/04/14 2230 11/04/14 2345  BP: 131/63 115/68 104/63 127/99  Pulse: 67 62 62 58  Temp:      TempSrc:      Resp: 26 22 19 19   Height:      Weight:      SpO2: 94% 98% 99% 96%    Wt Readings from Last 3 Encounters:  11/04/14 72.576 kg (160 lb)  11/01/14 74.844 kg (165 lb)  08/28/14 71.668 kg (158 lb)    General:  Ill appearing,  AAOx3 Eyes: PERRL, normal lids, irises & conjunctiva ENT: grossly normal hearing, lips & tongue Neck: no LAD, masses or thyromegaly Cardiovascular: RRR, no m/r/g. No LE edema. Telemetry: SR, no arrhythmias  Respiratory: scattered exp wheezes Abdomen: soft, ntnd Skin: no rash or induration seen on limited exam Musculoskeletal: grossly normal tone BUE/BLE Psychiatric: grossly normal mood and affect, speech fluent and appropriate Neurologic: grossly non-focal.          Labs on Admission:  Basic Metabolic Panel:  Recent Labs Lab 11/04/14 1857  NA 137  K 4.1  CL 98  GLUCOSE 114*  BUN 15  CREATININE 1.30   Liver Function Tests: No results for input(s): AST, ALT, ALKPHOS, BILITOT, PROT, ALBUMIN in the last 168 hours. No results for input(s): LIPASE, AMYLASE in the last 168 hours. No results for input(s): AMMONIA in the last 168 hours. CBC:  Recent Labs Lab 11/04/14 1857 11/04/14 1900  WBC  --  9.8  NEUTROABS  --  6.2  HGB 17.3* 15.5  HCT 51.0 45.7  MCV  --  97.2  PLT  --  207   Cardiac Enzymes: No results for input(s): CKTOTAL, CKMB, CKMBINDEX, TROPONINI in the last 168 hours.  BNP (last 3 results) No results for input(s): BNP in the last 8760 hours.  ProBNP (last 3 results) No results for input(s): PROBNP in the last 8760 hours.  CBG: No results for input(s): GLUCAP in the last 168 hours.  Radiological Exams on Admission: Dg Chest 2 View  11/04/2014   CLINICAL DATA:  Chest pain and cough for 1 month.  EXAM: CHEST  2 VIEW  COMPARISON:  11/01/2014 and prior chest radiographs.  FINDINGS: The cardiomediastinal silhouette is unremarkable.  Mild peribronchial thickening again noted.  There is no evidence of focal airspace disease, pulmonary edema, suspicious pulmonary nodule/mass, pleural effusion, or pneumothorax.  No acute bony abnormalities are identified.  IMPRESSION: No evidence of acute cardiopulmonary disease.  Mild chronic peribronchial thickening.    Electronically Signed   By: Margarette Canada M.D.   On: 11/04/2014 18:57   Ct Angio Chest Pe W/cm &/or Wo Cm  11/04/2014   CLINICAL DATA:  Acute onset of shortness of breath and productive cough. Headache and chest pain. Initial encounter.  EXAM: CT ANGIOGRAPHY CHEST WITH CONTRAST  TECHNIQUE: Multidetector CT imaging of the chest was performed using the standard protocol during bolus administration of intravenous contrast. Multiplanar CT image reconstructions and MIPs were obtained to evaluate the vascular anatomy.  CONTRAST:  20mL OMNIPAQUE IOHEXOL 350 MG/ML SOLN  COMPARISON:  Chest radiograph performed earlier today at 6:34 p.m.  FINDINGS: There is no evidence of pulmonary embolus.  Minimal peripheral peribronchovascular opacities are seen bilaterally, possibly reflecting an acute infectious or inflammatory process. There is mild diffuse peribronchial thickening. There is no evidence of significant focal consolidation, pleural effusion or pneumothorax. No masses are identified; no abnormal focal contrast enhancement is  seen.  Visualized mediastinal nodes remain normal in size. No pericardial effusion is identified. Incidental note is made of a direct origin of the left vertebral artery from the aortic arch. No axillary lymphadenopathy is seen. The visualized portions of the thyroid gland are unremarkable in appearance.  The visualized portions of the liver and spleen are unremarkable. The visualized portions of the pancreas, gallbladder, stomach and kidneys are within normal limits. Mild bilateral prominence of the adrenal glands could reflect mild adrenal hyperplasia.  No acute osseous abnormalities are seen.  Review of the MIP images confirms the above findings.  IMPRESSION: 1. No evidence of pulmonary embolus. 2. Minimal peripheral peribronchovascular opacities seen bilaterally, possibly reflecting an acute infectious or inflammatory process. Mild diffuse peribronchial thickening noted. 3. Mild bilateral  prominence of the adrenal glands could reflect mild adrenal hyperplasia.   Electronically Signed   By: Garald Balding M.D.   On: 11/04/2014 23:39    EKG: Independently reviewed. NSR, St depressions and T wave changes in inferior leads  Assessment/Plan  1. Bronchitis with COPD Exacerbation -r/o Flu, empiric Tamiflu pending FLu PCR -IV solumedrol, levaquin, duonebs scheduled and PRN  2. Tobacco abuse -counseled  3. Abnormal EKG -EKG changes in inferior leads compared to 2012 -no chest pain or acute symptoms at this time -check 2d ECHO  4. chronic back pain -continue gabapentin  Code Status:Full Code (must indicate code status--if unknown or must be presumed, indicate so) DVT Prophylaxis: lovenox Family Communication: girlfriend at bedside (indicate person spoken with, if applicable, with phone number if by telephone) Disposition Plan: inpatient (indicate anticipated LOS)  Time spent: 71min  Marabelle Cushman [facility] Pager [phone]

## 2014-11-04 NOTE — ED Notes (Signed)
Patient transported to CT 

## 2014-11-04 NOTE — ED Notes (Signed)
Pt c/o shortness of breath, productive cough.  Was seen here for same on 3/17.  St's not getting any better.  Also c/o headache and chest pain

## 2014-11-05 ENCOUNTER — Encounter (HOSPITAL_COMMUNITY): Payer: Self-pay | Admitting: General Practice

## 2014-11-05 LAB — BASIC METABOLIC PANEL
Anion gap: 9 (ref 5–15)
BUN: 9 mg/dL (ref 6–23)
CHLORIDE: 106 mmol/L (ref 96–112)
CO2: 23 mmol/L (ref 19–32)
Calcium: 8.1 mg/dL — ABNORMAL LOW (ref 8.4–10.5)
Creatinine, Ser: 1.07 mg/dL (ref 0.50–1.35)
GFR calc Af Amer: 87 mL/min — ABNORMAL LOW (ref >90)
GFR calc non Af Amer: 75 mL/min — ABNORMAL LOW (ref >90)
GLUCOSE: 168 mg/dL — AB (ref 70–99)
Potassium: 4.4 mmol/L (ref 3.5–5.1)
SODIUM: 138 mmol/L (ref 135–145)

## 2014-11-05 LAB — CBC
HCT: 39.9 % (ref 39.0–52.0)
Hemoglobin: 13.4 g/dL (ref 13.0–17.0)
MCH: 32.4 pg (ref 26.0–34.0)
MCHC: 33.6 g/dL (ref 30.0–36.0)
MCV: 96.6 fL (ref 78.0–100.0)
PLATELETS: 203 10*3/uL (ref 150–400)
RBC: 4.13 MIL/uL — ABNORMAL LOW (ref 4.22–5.81)
RDW: 12.2 % (ref 11.5–15.5)
WBC: 6.4 10*3/uL (ref 4.0–10.5)

## 2014-11-05 LAB — INFLUENZA PANEL BY PCR (TYPE A & B)
H1N1 flu by pcr: DETECTED — AB
Influenza A By PCR: POSITIVE — AB
Influenza B By PCR: NEGATIVE

## 2014-11-05 MED ORDER — ONDANSETRON HCL 4 MG PO TABS: 4.0000 mg | ORAL_TABLET | Freq: Four times a day (QID) | ORAL | Status: DC | PRN

## 2014-11-05 MED ORDER — GABAPENTIN 100 MG PO CAPS
100.0000 mg | ORAL_CAPSULE | Freq: Three times a day (TID) | ORAL | Status: DC
  Administered 2014-11-05 – 2014-11-07 (×5): 100 mg via ORAL
  Filled 2014-11-05 (×9): qty 1

## 2014-11-05 MED ORDER — SODIUM CHLORIDE 0.9 % IV SOLN
INTRAVENOUS | Status: DC
  Administered 2014-11-05: 22:00:00 via INTRAVENOUS

## 2014-11-05 MED ORDER — SODIUM CHLORIDE 0.9 % IV SOLN
INTRAVENOUS | Status: DC
  Administered 2014-11-05: 03:00:00 via INTRAVENOUS

## 2014-11-05 MED ORDER — ACETAMINOPHEN 650 MG RE SUPP: 650.0000 mg | Freq: Four times a day (QID) | RECTAL | Status: DC | PRN

## 2014-11-05 MED ORDER — LEVOFLOXACIN IN D5W 500 MG/100ML IV SOLN
500.0000 mg | Freq: Every day | INTRAVENOUS | Status: DC
  Administered 2014-11-05 (×2): 500 mg via INTRAVENOUS
  Filled 2014-11-05 (×3): qty 100

## 2014-11-05 MED ORDER — IPRATROPIUM-ALBUTEROL 0.5-2.5 (3) MG/3ML IN SOLN
3.0000 mL | Freq: Four times a day (QID) | RESPIRATORY_TRACT | Status: DC
  Administered 2014-11-05 – 2014-11-07 (×7): 3 mL via RESPIRATORY_TRACT
  Filled 2014-11-05 (×10): qty 3

## 2014-11-05 MED ORDER — ENOXAPARIN SODIUM 40 MG/0.4ML ~~LOC~~ SOLN
40.0000 mg | SUBCUTANEOUS | Status: DC
  Administered 2014-11-06: 40 mg via SUBCUTANEOUS
  Filled 2014-11-05 (×3): qty 0.4

## 2014-11-05 MED ORDER — ONDANSETRON HCL 4 MG/2ML IJ SOLN: 4.0000 mg | Freq: Four times a day (QID) | INTRAMUSCULAR | Status: DC | PRN

## 2014-11-05 MED ORDER — ALBUTEROL SULFATE (2.5 MG/3ML) 0.083% IN NEBU: 2.5000 mg | INHALATION_SOLUTION | RESPIRATORY_TRACT | Status: DC | PRN

## 2014-11-05 MED ORDER — OSELTAMIVIR PHOSPHATE 75 MG PO CAPS
75.0000 mg | ORAL_CAPSULE | Freq: Two times a day (BID) | ORAL | Status: DC
  Administered 2014-11-05 – 2014-11-07 (×5): 75 mg via ORAL
  Filled 2014-11-05 (×8): qty 1

## 2014-11-05 MED ORDER — METHYLPREDNISOLONE SODIUM SUCC 125 MG IJ SOLR
80.0000 mg | Freq: Four times a day (QID) | INTRAMUSCULAR | Status: DC
  Administered 2014-11-05 – 2014-11-06 (×7): 80 mg via INTRAVENOUS
  Filled 2014-11-05 (×7): qty 1.28
  Filled 2014-11-05: qty 2
  Filled 2014-11-05 (×2): qty 1.28

## 2014-11-05 MED ORDER — ACETAMINOPHEN 325 MG PO TABS
650.0000 mg | ORAL_TABLET | Freq: Four times a day (QID) | ORAL | Status: DC | PRN
  Filled 2014-11-05: qty 2

## 2014-11-05 NOTE — Progress Notes (Signed)
Utilization review completed.  

## 2014-11-05 NOTE — ED Notes (Signed)
Report attempted, RN on isolation room, we'll try again in 10 min.

## 2014-11-05 NOTE — Progress Notes (Signed)
[facility] PROGRESS NOTE  TASHA JINDRA WUJ:[id number] DOB: [birth year] DOA: 11/04/2014 PCP: No primary care provider on file. INTERIM summary:  59 year old male with h/o tobacco abuse admitted for sob and fever. He was found to be influenza positive.   Assessment/Plan: 1. Acute respiratory failure: probably from copd exacerbation and bronchitis/ influenza positive.  - tamiflu for flu, solumedrol and levaquin and bronchodilators as needed.   2. Abnormal EKG: No chest pain. poc troponin is negative.  Echocardiogram ordered and pending.    3. Chronic back pain: Continue gabapentin.  Code Status: full code Family Communication: family at bedside Disposition Plan: pending.    Consultants:  none  Procedures:  none  Antibiotics: levaquin  HPI/Subjective: Feeling okay.   Objective: Filed Vitals:   11/05/14 0552  BP: 113/60  Pulse:   Temp: 99 F (37.2 C)  Resp: 20    Intake/Output Summary (Last 24 hours) at 11/05/14 1733 Last data filed at 11/05/14 0900  Gross per 24 hour  Intake    240 ml  Output    800 ml  Net   -560 ml   Filed Weights   11/04/14 1819  Weight: 72.576 kg (160 lb)    Exam:   General:  Alert afebrile anxious   Cardiovascular: s1s2 tachycardic  Respiratory: bilateral wheezing heard posteriorly  Abdomen: soft non tender on distended bowel sounds heard  Musculoskeletal: no pedal edema.   Data Reviewed: Basic Metabolic Panel:  Recent Labs Lab 11/04/14 1857 11/05/14 0431  NA 137 138  K 4.1 4.4  CL 98 106  CO2  --  23  GLUCOSE 114* 168*  BUN 15 9  CREATININE 1.30 1.07  CALCIUM  --  8.1*   Liver Function Tests: No results for input(s): AST, ALT, ALKPHOS, BILITOT, PROT, ALBUMIN in the last 168 hours. No results for input(s): LIPASE, AMYLASE in the last 168 hours. No results for input(s): AMMONIA in the last 168 hours. CBC:  Recent Labs Lab 11/04/14 1857 11/04/14 1900 11/05/14 0431  WBC  --  9.8 6.4  NEUTROABS   --  6.2  --   HGB 17.3* 15.5 13.4  HCT 51.0 45.7 39.9  MCV  --  97.2 96.6  PLT  --  207 203   Cardiac Enzymes: No results for input(s): CKTOTAL, CKMB, CKMBINDEX, TROPONINI in the last 168 hours. BNP (last 3 results) No results for input(s): BNP in the last 8760 hours.  ProBNP (last 3 results) No results for input(s): PROBNP in the last 8760 hours.  CBG: No results for input(s): GLUCAP in the last 168 hours.  No results found for this or any previous visit (from the past 240 hour(s)).   Studies: Dg Chest 2 View  11/04/2014   CLINICAL DATA:  Chest pain and cough for 1 month.  EXAM: CHEST  2 VIEW  COMPARISON:  11/01/2014 and prior chest radiographs.  FINDINGS: The cardiomediastinal silhouette is unremarkable.  Mild peribronchial thickening again noted.  There is no evidence of focal airspace disease, pulmonary edema, suspicious pulmonary nodule/mass, pleural effusion, or pneumothorax.  No acute bony abnormalities are identified.  IMPRESSION: No evidence of acute cardiopulmonary disease.  Mild chronic peribronchial thickening.   Electronically Signed   By: Margarette Canada M.D.   On: 11/04/2014 18:57   Ct Angio Chest Pe W/cm &/or Wo Cm  11/04/2014   CLINICAL DATA:  Acute onset of shortness of breath and productive cough. Headache and chest pain. Initial encounter.  EXAM: CT ANGIOGRAPHY CHEST WITH  CONTRAST  TECHNIQUE: Multidetector CT imaging of the chest was performed using the standard protocol during bolus administration of intravenous contrast. Multiplanar CT image reconstructions and MIPs were obtained to evaluate the vascular anatomy.  CONTRAST:  30mL OMNIPAQUE IOHEXOL 350 MG/ML SOLN  COMPARISON:  Chest radiograph performed earlier today at 6:34 p.m.  FINDINGS: There is no evidence of pulmonary embolus.  Minimal peripheral peribronchovascular opacities are seen bilaterally, possibly reflecting an acute infectious or inflammatory process. There is mild diffuse peribronchial thickening. There is no  evidence of significant focal consolidation, pleural effusion or pneumothorax. No masses are identified; no abnormal focal contrast enhancement is seen.  Visualized mediastinal nodes remain normal in size. No pericardial effusion is identified. Incidental note is made of a direct origin of the left vertebral artery from the aortic arch. No axillary lymphadenopathy is seen. The visualized portions of the thyroid gland are unremarkable in appearance.  The visualized portions of the liver and spleen are unremarkable. The visualized portions of the pancreas, gallbladder, stomach and kidneys are within normal limits. Mild bilateral prominence of the adrenal glands could reflect mild adrenal hyperplasia.  No acute osseous abnormalities are seen.  Review of the MIP images confirms the above findings.  IMPRESSION: 1. No evidence of pulmonary embolus. 2. Minimal peripheral peribronchovascular opacities seen bilaterally, possibly reflecting an acute infectious or inflammatory process. Mild diffuse peribronchial thickening noted. 3. Mild bilateral prominence of the adrenal glands could reflect mild adrenal hyperplasia.   Electronically Signed   By: Garald Balding M.D.   On: 11/04/2014 23:39    Scheduled Meds: . enoxaparin (LOVENOX) injection  40 mg Subcutaneous Q24H  . gabapentin  100 mg Oral TID  . ipratropium-albuterol  3 mL Nebulization QID  . levofloxacin (LEVAQUIN) IV  500 mg Intravenous QHS  . methylPREDNISolone (SOLU-MEDROL) injection  80 mg Intravenous Q6H  . oseltamivir  75 mg Oral BID   Continuous Infusions: . sodium chloride      Active Problems:   COPD (chronic obstructive pulmonary disease)   COPD exacerbation   Viral syndrome   Tobacco abuse    Time spent: 25 minutes    [facility] Pager [phone]  If 7PM-7AM, please contact night-coverage at [email or web address], password [facility] 11/05/2014, 5:33 PM  LOS: 1 day

## 2014-11-06 DIAGNOSIS — R0600 Dyspnea, unspecified: Secondary | ICD-10-CM

## 2014-11-06 MED ORDER — NICOTINE 21 MG/24HR TD PT24
21.0000 mg | MEDICATED_PATCH | Freq: Every day | TRANSDERMAL | Status: DC
  Administered 2014-11-07: 21 mg via TRANSDERMAL
  Filled 2014-11-06 (×2): qty 1

## 2014-11-06 MED ORDER — PREDNISONE 10 MG PO TABS
60.0000 mg | ORAL_TABLET | Freq: Every day | ORAL | Status: DC
  Administered 2014-11-07: 60 mg via ORAL
  Filled 2014-11-06 (×3): qty 1

## 2014-11-06 MED ORDER — ALPRAZOLAM 0.25 MG PO TABS: 0.2500 mg | ORAL_TABLET | Freq: Every day | ORAL | Status: DC | PRN

## 2014-11-06 MED ORDER — LEVOFLOXACIN 500 MG PO TABS
500.0000 mg | ORAL_TABLET | Freq: Every day | ORAL | Status: DC
  Administered 2014-11-06: 500 mg via ORAL
  Filled 2014-11-06 (×2): qty 1

## 2014-11-06 MED ORDER — GUAIFENESIN-DM 100-10 MG/5ML PO SYRP: 5.0000 mL | ORAL_SOLUTION | ORAL | Status: DC | PRN

## 2014-11-06 MED ORDER — BENZONATATE 100 MG PO CAPS
200.0000 mg | ORAL_CAPSULE | Freq: Two times a day (BID) | ORAL | Status: DC | PRN
  Filled 2014-11-06: qty 2

## 2014-11-06 NOTE — Progress Notes (Signed)
Echocardiogram 2D Echocardiogram has been performed.  Mark Crosby 11/06/2014, 2:48 PM

## 2014-11-06 NOTE — Progress Notes (Signed)
[facility] PROGRESS NOTE  Mark Crosby TOI:[id number] DOB: [birth year] DOA: 11/04/2014 PCP: No primary care provider on file. INTERIM summary:  59 year old male with h/o tobacco abuse admitted for sob and fever. He was found to be influenza positive.  He is being treated for copd exacerbation and flu.  Assessment/Plan: 1. Acute respiratory failure: probably from copd exacerbation and bronchitis/ influenza positive.  - tamiflu for flu, he was started on IV solumedrol, his wheezing improved and transitioned to prednisone.   2. Abnormal EKG: No chest pain. poc troponin is negative. Repeat EKG ordered.  Echocardiogram ordered and pending.    3. Chronic back pain: Continue gabapentin.  Code Status: full code Family Communication: family at bedside Disposition Plan: possibly tomorrow after his breathing is better.    Consultants:  none  Procedures:  none  Antibiotics: levaquin  HPI/Subjective: Feeling much better today than yesterday. No chest pain .   Objective: Filed Vitals:   11/06/14 0418  BP: 148/85  Pulse: 93  Temp: 98 F (36.7 C)  Resp: 21    Intake/Output Summary (Last 24 hours) at 11/06/14 1603 Last data filed at 11/06/14 0400  Gross per 24 hour  Intake    800 ml  Output      0 ml  Net    800 ml   Filed Weights   11/04/14 1819 11/06/14 0418  Weight: 72.576 kg (160 lb) 67.813 kg (149 lb 8 oz)    Exam:   General:  Alert afebrile anxious   Cardiovascular: s1s2 tachycardic  Respiratory: good air entry bilateral , scattered wheezing.   Abdomen: soft non tender on distended bowel sounds heard  Musculoskeletal: no pedal edema.   Data Reviewed: Basic Metabolic Panel:  Recent Labs Lab 11/04/14 1857 11/05/14 0431  NA 137 138  K 4.1 4.4  CL 98 106  CO2  --  23  GLUCOSE 114* 168*  BUN 15 9  CREATININE 1.30 1.07  CALCIUM  --  8.1*   Liver Function Tests: No results for input(s): AST, ALT, ALKPHOS, BILITOT, PROT, ALBUMIN in the last  168 hours. No results for input(s): LIPASE, AMYLASE in the last 168 hours. No results for input(s): AMMONIA in the last 168 hours. CBC:  Recent Labs Lab 11/04/14 1857 11/04/14 1900 11/05/14 0431  WBC  --  9.8 6.4  NEUTROABS  --  6.2  --   HGB 17.3* 15.5 13.4  HCT 51.0 45.7 39.9  MCV  --  97.2 96.6  PLT  --  207 203   Cardiac Enzymes: No results for input(s): CKTOTAL, CKMB, CKMBINDEX, TROPONINI in the last 168 hours. BNP (last 3 results) No results for input(s): BNP in the last 8760 hours.  ProBNP (last 3 results) No results for input(s): PROBNP in the last 8760 hours.  CBG: No results for input(s): GLUCAP in the last 168 hours.  No results found for this or any previous visit (from the past 240 hour(s)).   Studies: Dg Chest 2 View  11/04/2014   CLINICAL DATA:  Chest pain and cough for 1 month.  EXAM: CHEST  2 VIEW  COMPARISON:  11/01/2014 and prior chest radiographs.  FINDINGS: The cardiomediastinal silhouette is unremarkable.  Mild peribronchial thickening again noted.  There is no evidence of focal airspace disease, pulmonary edema, suspicious pulmonary nodule/mass, pleural effusion, or pneumothorax.  No acute bony abnormalities are identified.  IMPRESSION: No evidence of acute cardiopulmonary disease.  Mild chronic peribronchial thickening.   Electronically Signed   By:  Margarette Canada M.D.   On: 11/04/2014 18:57   Ct Angio Chest Pe W/cm &/or Wo Cm  11/04/2014   CLINICAL DATA:  Acute onset of shortness of breath and productive cough. Headache and chest pain. Initial encounter.  EXAM: CT ANGIOGRAPHY CHEST WITH CONTRAST  TECHNIQUE: Multidetector CT imaging of the chest was performed using the standard protocol during bolus administration of intravenous contrast. Multiplanar CT image reconstructions and MIPs were obtained to evaluate the vascular anatomy.  CONTRAST:  37mL OMNIPAQUE IOHEXOL 350 MG/ML SOLN  COMPARISON:  Chest radiograph performed earlier today at 6:34 p.m.  FINDINGS:  There is no evidence of pulmonary embolus.  Minimal peripheral peribronchovascular opacities are seen bilaterally, possibly reflecting an acute infectious or inflammatory process. There is mild diffuse peribronchial thickening. There is no evidence of significant focal consolidation, pleural effusion or pneumothorax. No masses are identified; no abnormal focal contrast enhancement is seen.  Visualized mediastinal nodes remain normal in size. No pericardial effusion is identified. Incidental note is made of a direct origin of the left vertebral artery from the aortic arch. No axillary lymphadenopathy is seen. The visualized portions of the thyroid gland are unremarkable in appearance.  The visualized portions of the liver and spleen are unremarkable. The visualized portions of the pancreas, gallbladder, stomach and kidneys are within normal limits. Mild bilateral prominence of the adrenal glands could reflect mild adrenal hyperplasia.  No acute osseous abnormalities are seen.  Review of the MIP images confirms the above findings.  IMPRESSION: 1. No evidence of pulmonary embolus. 2. Minimal peripheral peribronchovascular opacities seen bilaterally, possibly reflecting an acute infectious or inflammatory process. Mild diffuse peribronchial thickening noted. 3. Mild bilateral prominence of the adrenal glands could reflect mild adrenal hyperplasia.   Electronically Signed   By: Garald Balding M.D.   On: 11/04/2014 23:39    Scheduled Meds: . enoxaparin (LOVENOX) injection  40 mg Subcutaneous Q24H  . gabapentin  100 mg Oral TID  . ipratropium-albuterol  3 mL Nebulization QID  . levofloxacin  500 mg Oral QHS  . oseltamivir  75 mg Oral BID  . [START ON 11/07/2014] predniSONE  60 mg Oral Q breakfast   Continuous Infusions:    Active Problems:   COPD (chronic obstructive pulmonary disease)   COPD exacerbation   Viral syndrome   Tobacco abuse    Time spent: 25 minutes    [facility] Pager [phone]  If 7PM-7AM, please contact night-coverage at [email or web address], password [facility] 11/06/2014, 4:03 PM  LOS: 2 days

## 2014-11-07 DIAGNOSIS — J101 Influenza due to other identified influenza virus with other respiratory manifestations: Secondary | ICD-10-CM | POA: Diagnosis present

## 2014-11-07 DIAGNOSIS — J441 Chronic obstructive pulmonary disease with (acute) exacerbation: Secondary | ICD-10-CM | POA: Insufficient documentation

## 2014-11-07 MED ORDER — PREDNISONE 20 MG PO TABS: 20.0000 mg | ORAL_TABLET | Freq: Every day | ORAL | Status: DC

## 2014-11-07 MED ORDER — OSELTAMIVIR PHOSPHATE 75 MG PO CAPS: 75.0000 mg | ORAL_CAPSULE | Freq: Two times a day (BID) | ORAL | Status: DC

## 2014-11-07 MED ORDER — ALBUTEROL SULFATE (2.5 MG/3ML) 0.083% IN NEBU: 2.5000 mg | INHALATION_SOLUTION | RESPIRATORY_TRACT | Status: DC | PRN

## 2014-11-07 NOTE — Progress Notes (Signed)
Discharge instructions given to patient and family member.  They included the following:  smoking cessation, medication and prescription, and when to call the MD.  Follow up appointments were also discussed.  Comprehension of information was evaluated via the use of "teach-back" and hand-outs.  Pt. Ambulated to exit as per his request with friend.

## 2014-11-07 NOTE — Discharge Summary (Signed)
Physician Discharge Summary  Mark Crosby WJX:[id number] DOB: 04-16-1956 DOA: 11/04/2014  PCP: No primary care provider on file.  Admit date: 11/04/2014 Discharge date: 11/07/2014  Time spent: 45 minutes  Recommendations for Outpatient Follow-up:  1. PCP in 1 week 2. Needs Pulm FU and PFTs as outpatient and consideration of inhaled steroids, if COPD is diagnosed  Discharge Diagnoses:  Active Problems:   COPD (chronic obstructive pulmonary disease)   COPD exacerbation   Viral syndrome   Tobacco abuse   Influenza A   Chronic obstructive pulmonary disease with acute exacerbation   Discharge Condition: stable  Diet recommendation: stable  Filed Weights   11/04/14 1819 11/06/14 0418 11/07/14 0412  Weight: 72.576 kg (160 lb) 67.813 kg (149 lb 8 oz) 67.949 kg (149 lb 12.8 oz)    History of present illness:Mark Crosby is a 59 y.o. male with PMH of Tobacco abuse, chronic back pain presents to the ER with cough and dyspnea. He reported feeling poorly for over 1 week, symptoms started with cough, congestion, bodyaches, fevers and chills, he was seen in the ER 3days prior and was discharged after supportive care. Then felt bad again the next day with persistence of headaches, fevers, congestion, cough and bodyaches along with shortness of breath and wheezing.  Hospital Course:  1. Acute respiratory failure: -due to copd exacerbation with bronchitis/ influenza positive. - improved with solumedrol, Abx, tamiflu and nebs - discharged home in stable conditon on prednisone taper, tamiflu -advised to FU with PCP and consider inhaled steroids if/when COPD is officially diagnosed.   2. Abnormal EKG: -No chest pain. poc troponin is negative.  -2 DEchocardiogram with normal EF and wall motion  3. Chronic back pain: Continue gabapentin  4. Tobacco abuse -counseled and options discussed  Discharge Exam: Filed Vitals:   11/07/14 0412  BP: 147/84  Pulse: 92  Temp: 98 F (36.7 C)   Resp: 20    General: AAOx3, no distress Cardiovascular: S1S2/RRR Respiratory: CTAB  Discharge Instructions   Discharge Instructions    Diet general    Complete by:  As directed      Increase activity slowly    Complete by:  As directed           Discharge Medication List as of 11/07/2014  9:52 AM    START taking these medications   Details  oseltamivir (TAMIFLU) 75 MG capsule Take 1 capsule (75 mg total) by mouth 2 (two) times daily. For 3days, Starting 11/07/2014, Until Discontinued, Normal    predniSONE (DELTASONE) 20 MG tablet Take 1-3 tablets (20-60 mg total) by mouth daily with breakfast. Take 60mg  for 1day then 40mg  for 2days then 20mg  for 2days then STOP, Starting 11/07/2014, Until Discontinued, Normal      CONTINUE these medications which have NOT CHANGED   Details  albuterol (PROVENTIL HFA;VENTOLIN HFA) 108 (90 BASE) MCG/ACT inhaler Inhale 2 puffs into the lungs every 6 (six) hours as needed for wheezing or shortness of breath., Until Discontinued, Historical Med    guaiFENesin (ROBITUSSIN) 100 MG/5ML liquid Take 200 mg by mouth 3 (three) times daily as needed for cough., Until Discontinued, Historical Med    meloxicam (MOBIC) 7.5 MG tablet Take 1 tablet by mouth twice a day with meal or snack., Normal    gabapentin (NEURONTIN) 100 MG capsule Take 1 capsule (100 mg total) by mouth 3 (three) times daily., Starting 08/28/2014, Until Discontinued, Normal      STOP taking these medications     Naproxen  Sod-Diphenhydramine 220-25 MG TABS      fluconazole (DIFLUCAN) 150 MG tablet        Allergies  Allergen Reactions  . Percocet [Oxycodone-Acetaminophen] Itching   Follow-up Information    Follow up with Dr.McPherson. Schedule an appointment as soon as possible for a visit in 1 week.       The results of significant diagnostics from this hospitalization (including imaging, microbiology, ancillary and laboratory) are listed below for reference.    Significant  Diagnostic Studies: Dg Chest 2 View  11/04/2014   CLINICAL DATA:  Chest pain and cough for 1 month.  EXAM: CHEST  2 VIEW  COMPARISON:  11/01/2014 and prior chest radiographs.  FINDINGS: The cardiomediastinal silhouette is unremarkable.  Mild peribronchial thickening again noted.  There is no evidence of focal airspace disease, pulmonary edema, suspicious pulmonary nodule/mass, pleural effusion, or pneumothorax.  No acute bony abnormalities are identified.  IMPRESSION: No evidence of acute cardiopulmonary disease.  Mild chronic peribronchial thickening.   Electronically Signed   By: Margarette Canada M.D.   On: 11/04/2014 18:57   Dg Chest 2 View  11/01/2014   CLINICAL DATA:  59 year old with chest pain and shortness of breath for 1 week.  EXAM: CHEST  2 VIEW  COMPARISON:  08/18/2014  FINDINGS: The heart size and mediastinal contours are within normal limits. Both lungs are clear. The visualized skeletal structures are unremarkable.  IMPRESSION: No active cardiopulmonary disease.   Electronically Signed   By: Markus Daft M.D.   On: 11/01/2014 10:17   Ct Angio Chest Pe W/cm &/or Wo Cm  11/04/2014   CLINICAL DATA:  Acute onset of shortness of breath and productive cough. Headache and chest pain. Initial encounter.  EXAM: CT ANGIOGRAPHY CHEST WITH CONTRAST  TECHNIQUE: Multidetector CT imaging of the chest was performed using the standard protocol during bolus administration of intravenous contrast. Multiplanar CT image reconstructions and MIPs were obtained to evaluate the vascular anatomy.  CONTRAST:  3mL OMNIPAQUE IOHEXOL 350 MG/ML SOLN  COMPARISON:  Chest radiograph performed earlier today at 6:34 p.m.  FINDINGS: There is no evidence of pulmonary embolus.  Minimal peripheral peribronchovascular opacities are seen bilaterally, possibly reflecting an acute infectious or inflammatory process. There is mild diffuse peribronchial thickening. There is no evidence of significant focal consolidation, pleural effusion or  pneumothorax. No masses are identified; no abnormal focal contrast enhancement is seen.  Visualized mediastinal nodes remain normal in size. No pericardial effusion is identified. Incidental note is made of a direct origin of the left vertebral artery from the aortic arch. No axillary lymphadenopathy is seen. The visualized portions of the thyroid gland are unremarkable in appearance.  The visualized portions of the liver and spleen are unremarkable. The visualized portions of the pancreas, gallbladder, stomach and kidneys are within normal limits. Mild bilateral prominence of the adrenal glands could reflect mild adrenal hyperplasia.  No acute osseous abnormalities are seen.  Review of the MIP images confirms the above findings.  IMPRESSION: 1. No evidence of pulmonary embolus. 2. Minimal peripheral peribronchovascular opacities seen bilaterally, possibly reflecting an acute infectious or inflammatory process. Mild diffuse peribronchial thickening noted. 3. Mild bilateral prominence of the adrenal glands could reflect mild adrenal hyperplasia.   Electronically Signed   By: Garald Balding M.D.   On: 11/04/2014 23:39    Microbiology: No results found for this or any previous visit (from the past 240 hour(s)).   Labs: Basic Metabolic Panel:  Recent Labs Lab 11/04/14 1857 11/05/14 0431  NA 137 138  K 4.1 4.4  CL 98 106  CO2  --  23  GLUCOSE 114* 168*  BUN 15 9  CREATININE 1.30 1.07  CALCIUM  --  8.1*   Liver Function Tests: No results for input(s): AST, ALT, ALKPHOS, BILITOT, PROT, ALBUMIN in the last 168 hours. No results for input(s): LIPASE, AMYLASE in the last 168 hours. No results for input(s): AMMONIA in the last 168 hours. CBC:  Recent Labs Lab 11/04/14 1857 11/04/14 1900 11/05/14 0431  WBC  --  9.8 6.4  NEUTROABS  --  6.2  --   HGB 17.3* 15.5 13.4  HCT 51.0 45.7 39.9  MCV  --  97.2 96.6  PLT  --  207 203   Cardiac Enzymes: No results for input(s): CKTOTAL, CKMB,  CKMBINDEX, TROPONINI in the last 168 hours. BNP: BNP (last 3 results) No results for input(s): BNP in the last 8760 hours.  ProBNP (last 3 results) No results for input(s): PROBNP in the last 8760 hours.  CBG: No results for input(s): GLUCAP in the last 168 hours.     SignedDomenic Polite  [facility] 11/07/2014, 3:46 PM

## 2014-11-15 ENCOUNTER — Ambulatory Visit (AMBULATORY_SURGERY_CENTER): Payer: Self-pay

## 2014-11-15 VITALS — Ht 67.5 in | Wt 159.8 lb

## 2014-11-15 DIAGNOSIS — Z1211 Encounter for screening for malignant neoplasm of colon: Secondary | ICD-10-CM

## 2014-11-15 NOTE — Progress Notes (Signed)
Per pt, no allergies to soy or egg products.Pt not taking any weight loss meds or using  O2 at home. 

## 2014-11-16 ENCOUNTER — Encounter: Payer: Self-pay | Admitting: Internal Medicine

## 2014-11-27 ENCOUNTER — Ambulatory Visit (AMBULATORY_SURGERY_CENTER): Payer: 59 | Admitting: Internal Medicine

## 2014-11-27 ENCOUNTER — Encounter: Payer: Self-pay | Admitting: Internal Medicine

## 2014-11-27 VITALS — BP 143/90 | HR 81 | Temp 99.2°F | Resp 19 | Ht 67.0 in | Wt 159.0 lb

## 2014-11-27 DIAGNOSIS — K5730 Diverticulosis of large intestine without perforation or abscess without bleeding: Secondary | ICD-10-CM

## 2014-11-27 DIAGNOSIS — Z1211 Encounter for screening for malignant neoplasm of colon: Secondary | ICD-10-CM

## 2014-11-27 DIAGNOSIS — D123 Benign neoplasm of transverse colon: Secondary | ICD-10-CM

## 2014-11-27 DIAGNOSIS — D125 Benign neoplasm of sigmoid colon: Secondary | ICD-10-CM

## 2014-11-27 DIAGNOSIS — K648 Other hemorrhoids: Secondary | ICD-10-CM

## 2014-11-27 MED ORDER — SODIUM CHLORIDE 0.9 % IV SOLN: 500.0000 mL | INTRAVENOUS | Status: DC

## 2014-11-27 NOTE — Patient Instructions (Addendum)
I found and removed 4 polyps today. None of them look like cancer. You also have a condition called diverticulosis - common and not usually a problem. Please read the handout provided. I also found hemorrhoids.  I will let you know pathology results and when to have another routine colonoscopy by mail.  I appreciate the opportunity to care for you. Gatha Mayer, MD, FACG  YOU HAD AN ENDOSCOPIC PROCEDURE TODAY AT [facility]:   Refer to the procedure report that was given to you for any specific questions about what was found during the examination.  If the procedure report does not answer your questions, please call your gastroenterologist to clarify.  If you requested that your care partner not be given the details of your procedure findings, then the procedure report has been included in a sealed envelope for you to review at your convenience later.  YOU SHOULD EXPECT: Some feelings of bloating in the abdomen. Passage of more gas than usual.  Walking can help get rid of the air that was put into your GI tract during the procedure and reduce the bloating. If you had a lower endoscopy (such as a colonoscopy or flexible sigmoidoscopy) you may notice spotting of blood in your stool or on the toilet paper. If you underwent a bowel prep for your procedure, you may not have a normal bowel movement for a few days.  Please Note:  You might notice some irritation and congestion in your nose or some drainage.  This is from the oxygen used during your procedure.  There is no need for concern and it should clear up in a day or so.  SYMPTOMS TO REPORT IMMEDIATELY:   Following lower endoscopy (colonoscopy or flexible sigmoidoscopy):  Excessive amounts of blood in the stool  Significant tenderness or worsening of abdominal pains  Swelling of the abdomen that is new, acute  Fever of 100F or higher   Following upper endoscopy (EGD)  Vomiting of blood or coffee ground  material  New chest pain or pain under the shoulder blades  Painful or persistently difficult swallowing  New shortness of breath  Fever of 100F or higher  Black, tarry-looking stools  For urgent or emergent issues, a gastroenterologist can be reached at any hour by calling [phone].   DIET: Your first meal following the procedure should be a small meal and then it is ok to progress to your normal diet. Heavy or fried foods are harder to digest and may make you feel nauseous or bloated.  Likewise, meals heavy in dairy and vegetables can increase bloating.  Drink plenty of fluids but you should avoid alcoholic beverages for 24 hours.  ACTIVITY:  You should plan to take it easy for the rest of today and you should NOT DRIVE or use heavy machinery until tomorrow (because of the sedation medicines used during the test).    FOLLOW UP: Our staff will call the number listed on your records the next business day following your procedure to check on you and address any questions or concerns that you may have regarding the information given to you following your procedure. If we do not reach you, we will leave a message.  However, if you are feeling well and you are not experiencing any problems, there is no need to return our call.  We will assume that you have returned to your regular daily activities without incident.  If any biopsies were taken you will be contacted  by phone or by letter within the next 1-3 weeks.  Please call us at [phone] if you have not heard about the biopsies in 3 weeks.    SIGNATURES/CONFIDENTIALITY: You and/or your care partner have signed paperwork which will be entered into your electronic medical record.  These signatures attest to the fact that that the information above on your After Visit Summary has been reviewed and is understood.  Full responsibility of the confidentiality of this discharge information lies with you and/or your care-partner.  Please  follow all discharge instructions given to you by the recovery room nurse. If you have any questions or problems after discharge please call the number listed above. You will receive a phone call in the am to see how you are doing and answer any questions you may have. Thank you for choosing [facility] for your health care needs.

## 2014-11-27 NOTE — Progress Notes (Signed)
Pt. States that he drank 12oz beer and 8 oz apple juice after getting off work at 1400 today.  Dr. Carlean Purl advised.  Will wait 2 hours before starting procedure.

## 2014-11-27 NOTE — Progress Notes (Signed)
Patient awakening,vss,report to rn 

## 2014-11-27 NOTE — Progress Notes (Signed)
Called to room to assist during endoscopic procedure.  Patient ID and intended procedure confirmed with present staff. Received instructions for my participation in the procedure from the performing physician.  

## 2014-11-27 NOTE — Op Note (Signed)
[place]  [address] [place], [id number]   COLONOSCOPY PROCEDURE REPORT  PATIENT: Mark Crosby, Mark Crosby  MR#: [id number] BIRTHDATE: [birth year] , 79  yrs. old GENDER: male ENDOSCOPIST: Gatha Mayer, MD, [facility] PROCEDURE DATE:  11/27/2014 PROCEDURE:   Colonoscopy, screening First Screening Colonoscopy - Avg.  risk and is 50 yrs.  old or older Yes.  Prior Negative Screening - Now for repeat screening. N/A  History of Adenoma - Now for follow-up colonoscopy & has been > or = to 3 yrs.  N/A ASA CLASS:   Class II INDICATIONS:Screening for colonic neoplasia and Colorectal Neoplasm Risk Assessment for this procedure is average risk. MEDICATIONS: Propofol 400 mg IV and Monitored anesthesia care  DESCRIPTION OF PROCEDURE:   After the risks benefits and alternatives of the procedure were thoroughly explained, informed consent was obtained.  The digital rectal exam revealed no abnormalities of the rectum, revealed no prostatic nodules, and revealed the prostate was not enlarged.   The LB [id number] endoscope was introduced through the anus and advanced to the cecum, which was identified by both the appendix and ileocecal valve. No adverse events experienced.   The quality of the prep was good.  (MiraLax was used)  The instrument was then slowly withdrawn as the colon was fully examined.  COLON FINDINGS: Four polyps (3 sessile, 1 pedunculated) ranging from 2 to 2mm in size were found in the transverse colon and sigmoid colon.  Polypectomies were performed with a cold snare.  The resection was complete, the polyp tissue was completely retrieved and sent to histology.   There was diverticulosis noted in the sigmoid colon and right colon.   Internal hemorrhoids were found. The examination was otherwise normal.  Retroflexed views revealed internal hemorrhoids. The time to cecum = 3.7 Withdrawal time = 15.9   The scope was withdrawn and the procedure  completed. COMPLICATIONS: There were no immediate complications.  ENDOSCOPIC IMPRESSION: 1.   Four polyps ranging from 2 to 87mm in size were found in the transverse colon and sigmoid colon; polypectomies were performed with a cold snare 2.   There was diverticulosis noted in the sigmoid colon and right colon 3.   Internal hemorrhoids 4.   The examination was otherwise normal - good prep  RECOMMENDATIONS: Timing of repeat colonoscopy will be determined by pathology findings.  eSigned:  Gatha Mayer, MD, [facility] 11/27/2014 5:06 PM   cc: The Patient and Ellsworth Lennox, MD

## 2014-11-28 ENCOUNTER — Telehealth: Payer: Self-pay

## 2014-11-28 NOTE — Telephone Encounter (Signed)
Left a message at [phone] for the pt on answering machine for the pt to call us back if any questions or concerns. maw

## 2014-12-12 ENCOUNTER — Encounter: Payer: Self-pay | Admitting: Internal Medicine

## 2014-12-12 DIAGNOSIS — Z860101 Personal history of adenomatous and serrated colon polyps: Secondary | ICD-10-CM

## 2014-12-12 DIAGNOSIS — Z86010 Personal history of colonic polyps: Secondary | ICD-10-CM

## 2014-12-12 HISTORY — DX: Personal history of colonic polyps: Z86.010

## 2014-12-12 HISTORY — DX: Personal history of adenomatous and serrated colon polyps: Z86.0101

## 2014-12-12 NOTE — Progress Notes (Signed)
Quick Note:  4 diminutive adenomas - repeat colonoscopy 2019 ______

## 2015-02-03 ENCOUNTER — Encounter (HOSPITAL_COMMUNITY): Payer: Self-pay | Admitting: Emergency Medicine

## 2015-02-03 ENCOUNTER — Emergency Department (HOSPITAL_COMMUNITY)
Admission: EM | Admit: 2015-02-03 | Discharge: 2015-02-05 | Disposition: A | Discharge status: Other Acute Inpatient Hospital | Payer: Federal, State, Local not specified - Other | Attending: Emergency Medicine | Admitting: Emergency Medicine

## 2015-02-03 DIAGNOSIS — F1910 Other psychoactive substance abuse, uncomplicated: Secondary | ICD-10-CM

## 2015-02-03 DIAGNOSIS — T50902A Poisoning by unspecified drugs, medicaments and biological substances, intentional self-harm, initial encounter: Secondary | ICD-10-CM | POA: Insufficient documentation

## 2015-02-03 DIAGNOSIS — Z86010 Personal history of colonic polyps: Secondary | ICD-10-CM | POA: Insufficient documentation

## 2015-02-03 DIAGNOSIS — F322 Major depressive disorder, single episode, severe without psychotic features: Secondary | ICD-10-CM | POA: Diagnosis present

## 2015-02-03 DIAGNOSIS — Z8719 Personal history of other diseases of the digestive system: Secondary | ICD-10-CM | POA: Insufficient documentation

## 2015-02-03 DIAGNOSIS — M1990 Unspecified osteoarthritis, unspecified site: Secondary | ICD-10-CM | POA: Insufficient documentation

## 2015-02-03 DIAGNOSIS — F1210 Cannabis abuse, uncomplicated: Secondary | ICD-10-CM | POA: Insufficient documentation

## 2015-02-03 DIAGNOSIS — G8929 Other chronic pain: Secondary | ICD-10-CM | POA: Insufficient documentation

## 2015-02-03 DIAGNOSIS — T1491XA Suicide attempt, initial encounter: Secondary | ICD-10-CM

## 2015-02-03 DIAGNOSIS — Z79899 Other long term (current) drug therapy: Secondary | ICD-10-CM | POA: Insufficient documentation

## 2015-02-03 DIAGNOSIS — F1010 Alcohol abuse, uncomplicated: Secondary | ICD-10-CM | POA: Insufficient documentation

## 2015-02-03 DIAGNOSIS — Z87891 Personal history of nicotine dependence: Secondary | ICD-10-CM | POA: Insufficient documentation

## 2015-02-03 DIAGNOSIS — T424X2A Poisoning by benzodiazepines, intentional self-harm, initial encounter: Secondary | ICD-10-CM | POA: Insufficient documentation

## 2015-02-03 DIAGNOSIS — F1410 Cocaine abuse, uncomplicated: Secondary | ICD-10-CM | POA: Insufficient documentation

## 2015-02-03 DIAGNOSIS — J449 Chronic obstructive pulmonary disease, unspecified: Secondary | ICD-10-CM | POA: Insufficient documentation

## 2015-02-03 HISTORY — DX: Cervicalgia: M54.2

## 2015-02-03 HISTORY — DX: Diverticulosis of intestine, part unspecified, without perforation or abscess without bleeding: K57.90

## 2015-02-03 HISTORY — DX: Other chronic pain: G89.29

## 2015-02-03 LAB — COMPREHENSIVE METABOLIC PANEL
ALK PHOS: 76 U/L (ref 38–126)
ALT: 18 U/L (ref 17–63)
ANION GAP: 10 (ref 5–15)
AST: 28 U/L (ref 15–41)
Albumin: 3.9 g/dL (ref 3.5–5.0)
BUN: 10 mg/dL (ref 6–20)
CALCIUM: 9.1 mg/dL (ref 8.9–10.3)
CO2: 24 mmol/L (ref 22–32)
Chloride: 107 mmol/L (ref 101–111)
Creatinine, Ser: 1.08 mg/dL (ref 0.61–1.24)
GFR calc Af Amer: >60 mL/min (ref >60)
GFR calc non Af Amer: >60 mL/min (ref >60)
Glucose, Bld: 128 mg/dL — ABNORMAL HIGH (ref 65–99)
POTASSIUM: 3.7 mmol/L (ref 3.5–5.1)
SODIUM: 141 mmol/L (ref 135–145)
TOTAL PROTEIN: 6.6 g/dL (ref 6.5–8.1)
Total Bilirubin: 0.9 mg/dL (ref 0.3–1.2)

## 2015-02-03 LAB — SALICYLATE LEVEL
Salicylate Lvl: <4 mg/dL (ref 2.8–30.0)
Salicylate Lvl: <4 mg/dL (ref 2.8–30.0)

## 2015-02-03 LAB — CBC
HEMATOCRIT: 47.9 % (ref 39.0–52.0)
Hemoglobin: 15.8 g/dL (ref 13.0–17.0)
MCH: 32 pg (ref 26.0–34.0)
MCHC: 33 g/dL (ref 30.0–36.0)
MCV: 97.2 fL (ref 78.0–100.0)
Platelets: 280 10*3/uL (ref 150–400)
RBC: 4.93 MIL/uL (ref 4.22–5.81)
RDW: 12.7 % (ref 11.5–15.5)
WBC: 7.8 10*3/uL (ref 4.0–10.5)

## 2015-02-03 LAB — ACETAMINOPHEN LEVEL

## 2015-02-03 LAB — RAPID URINE DRUG SCREEN, HOSP PERFORMED
AMPHETAMINES: NOT DETECTED
BARBITURATES: NOT DETECTED
Benzodiazepines: POSITIVE — AB
Cocaine: POSITIVE — AB
Opiates: NOT DETECTED
TETRAHYDROCANNABINOL: POSITIVE — AB

## 2015-02-03 LAB — ETHANOL: Alcohol, Ethyl (B): <5 mg/dL (ref <5)

## 2015-02-03 MED ORDER — IBUPROFEN 200 MG PO TABS: 600.0000 mg | ORAL_TABLET | Freq: Three times a day (TID) | ORAL | Status: DC | PRN

## 2015-02-03 MED ORDER — NICOTINE 21 MG/24HR TD PT24
21.0000 mg | MEDICATED_PATCH | Freq: Every day | TRANSDERMAL | Status: DC
  Filled 2015-02-03 (×2): qty 1

## 2015-02-03 MED ORDER — ONDANSETRON HCL 4 MG PO TABS: 4.0000 mg | ORAL_TABLET | Freq: Three times a day (TID) | ORAL | Status: DC | PRN

## 2015-02-03 MED ORDER — ACETAMINOPHEN 325 MG PO TABS: 650.0000 mg | ORAL_TABLET | ORAL | Status: DC | PRN

## 2015-02-03 MED ORDER — ALBUTEROL SULFATE HFA 108 (90 BASE) MCG/ACT IN AERS: 2.0000 | INHALATION_SPRAY | Freq: Four times a day (QID) | RESPIRATORY_TRACT | Status: DC | PRN

## 2015-02-03 MED ORDER — ALUM & MAG HYDROXIDE-SIMETH 200-200-20 MG/5ML PO SUSP: 30.0000 mL | ORAL | Status: DC | PRN

## 2015-02-03 NOTE — ED Notes (Signed)
Patient awake and had two sandwiches, 4 juices, two cheeses and 1 milk

## 2015-02-03 NOTE — ED Notes (Signed)
Pt BIB nephew.  Nephew states that pt took 8 valium and was mixing that with alcohol.  Pt admits to taking 4 more pills of unknown name.  Took these pills around 1000.  Pt A&O x 4.

## 2015-02-03 NOTE — ED Notes (Signed)
After family member left the room, patient states, "I just can't go on, I don't want to be here" and became tearful. States he feels all alone, sister has cancer, brother-in-law has cancer, "They won't be here long". Nephew back at bedside.

## 2015-02-03 NOTE — ED Notes (Signed)
Patient placed in paper scrubs,clothing and watch at nursing station.  Security present and wanded patient and visitor.

## 2015-02-03 NOTE — [facility] Counselor (Signed)
Consulted with Waylan Boga, DNP who states patient is to be observed overnight and re-evaluated in the morning by Psychiatry, counselor notified EDP of disposition and she agrees.

## 2015-02-03 NOTE — ED Notes (Signed)
Pt aware we need urine specimen. Urinal provided.

## 2015-02-03 NOTE — ED Provider Notes (Signed)
CSN: [id number]     Arrival date & time 02/03/15  1226 History   First MD Initiated Contact with Patient 02/03/15 1241     Chief Complaint  Patient presents with  . Drug Overdose     HPI Pt was seen at 1255. Per pt's nephew and pt: c/o gradual onset and worsening of persistent depression and SI for the past few days. Pt's nephew states he was "called by some girl" and told that pt "overdosed on pills and was drinking alcohol." Pt's nephew found pt sitting outside of a house with "a beer in his hand." Pt states he took 8 valium and 4 other pills at 1000am in [place]. Pt states he "can't go on" and "don't want to be here." Unclear what the 4 other pills were. Denies HI, no hallucinations, no CP/SOB, no abd pain, no N/V/D.    Past Medical History  Diagnosis Date  . Allergy   . Arthritis   . COPD (chronic obstructive pulmonary disease)   . Shortness of breath dyspnea   . Numbness of fingers     left index finger and thumb numbness  . Muscle spasms of lower extremity     spasms in lower back for couple weeks  . Hx of adenomatous colonic polyps 12/12/2014  . Chronic neck pain   . Diverticulosis    Past Surgical History  Procedure Laterality Date  . Toenail excision      ingrown toenail removed  . Colonoscopy     Family History  Problem Relation Age of Onset  . Heart disease Mother     MI  . Stroke Sister   . Lupus Sister    History  Substance Use Topics  . Smoking status: Former Smoker -- 41 years    Types: Pipe    Quit date: 11/04/2014  . Smokeless tobacco: Former Systems developer    Types: Chew  . Alcohol Use: 0.6 oz/week    1 Cans of beer, 0 Standard drinks or equivalent per week     Comment: Occasional    Review of Systems ROS: Statement: All systems negative except as marked or noted in the HPI; Constitutional: Negative for fever and chills. ; ; Eyes: Negative for eye pain, redness and discharge. ; ; ENMT: Negative for ear pain, hoarseness, nasal congestion, sinus pressure and sore  throat. ; ; Cardiovascular: Negative for chest pain, palpitations, diaphoresis, dyspnea and peripheral edema. ; ; Respiratory: Negative for cough, wheezing and stridor. ; ; Gastrointestinal: Negative for nausea, vomiting, diarrhea, abdominal pain, blood in stool, hematemesis, jaundice and rectal bleeding. . ; ; Genitourinary: Negative for dysuria, flank pain and hematuria. ; ; Musculoskeletal: Negative for back pain and neck pain. Negative for swelling and trauma.; ; Skin: Negative for pruritus, rash, abrasions, blisters, bruising and skin lesion.; ; Neuro: Negative for headache, lightheadedness and neck stiffness. Negative for weakness, altered level of consciousness , altered mental status, extremity weakness, paresthesias, involuntary movement, seizure and syncope.; Psych:  +SI, +SA. No HI, no hallucinations.      Allergies  Percocet  Home Medications   Prior to Admission medications   Medication Sig Start Date End Date Taking? Authorizing Provider  albuterol (PROVENTIL HFA;VENTOLIN HFA) 108 (90 BASE) MCG/ACT inhaler Inhale 2 puffs into the lungs every 6 (six) hours as needed for wheezing or shortness of breath.    Historical Provider, MD  gabapentin (NEURONTIN) 100 MG capsule Take 1 capsule (100 mg total) by mouth 3 (three) times daily. Patient not taking: Reported on  11/04/2014 08/28/14   Barton Fanny, MD  guaiFENesin (ROBITUSSIN) 100 MG/5ML liquid Take 200 mg by mouth 3 (three) times daily as needed for cough.    Historical Provider, MD  meloxicam (MOBIC) 7.5 MG tablet Take 1 tablet by mouth twice a day with meal or snack. Patient not taking: Reported on 11/15/2014 08/28/14   Barton Fanny, MD   BP 153/77 mmHg  Pulse 91  Temp(Src) 98.1 F (36.7 C) (Oral)  Resp 18  SpO2 97% Physical Exam  1300: Physical examination:  Nursing notes reviewed; Vital signs and O2 SAT reviewed;  Constitutional: Well developed, Well nourished, Well hydrated, In no acute distress; Head:   Normocephalic, atraumatic; Eyes: EOMI, PERRL, No scleral icterus; ENMT: Mouth and pharynx normal, Mucous membranes moist; Neck: Supple, Full range of motion, No lymphadenopathy; Cardiovascular: Regular rate and rhythm, No gallop; Respiratory: Breath sounds clear & equal bilaterally, No wheezes.  Speaking full sentences with ease, Normal respiratory effort/excursion; Chest: Nontender, Movement normal; Abdomen: Soft, Nontender, Nondistended, Normal bowel sounds; Genitourinary: No CVA tenderness; Extremities: Pulses normal, No tenderness, No edema, No calf edema or asymmetry.; Neuro: AA&Ox3, Major CN grossly intact.  Speech clear. No gross focal motor or sensory deficits in extremities.; Skin: Color normal, Warm, Dry.; Psych:  Endorses SI. Affect flat, poor eye contact.    ED Course  Procedures     EKG Interpretation   Date/Time:  Sunday February 03 2015 12:36:42 EDT Ventricular Rate:  99 PR Interval:  123 QRS Duration: 73 QT Interval:  338 QTC Calculation: 434 R Axis:   45 Text Interpretation:  Sinus rhythm LAE, consider biatrial enlargement When  compared with ECG of 11/04/2014 Rate slower Confirmed by [facility]  MD,  Nunzio Cory [phone]) on 02/03/2015 1:25:14 PM      MDM  MDM Reviewed: previous chart, nursing note and vitals Reviewed previous: ECG and labs Interpretation: labs and ECG   Results for orders placed or performed during the hospital encounter of 02/03/15  Acetaminophen level  Result Value Ref Range   Acetaminophen (Tylenol), Serum <10 (L) 10 - 30 ug/mL  CBC  Result Value Ref Range   WBC 7.8 4.0 - 10.5 K/uL   RBC 4.93 4.22 - 5.81 MIL/uL   Hemoglobin 15.8 13.0 - 17.0 g/dL   HCT 47.9 39.0 - 52.0 %   MCV 97.2 78.0 - 100.0 fL   MCH 32.0 26.0 - 34.0 pg   MCHC 33.0 30.0 - 36.0 g/dL   RDW 12.7 11.5 - 15.5 %   Platelets 280 150 - 400 K/uL  Comprehensive metabolic panel  Result Value Ref Range   Sodium 141 135 - 145 mmol/L   Potassium 3.7 3.5 - 5.1 mmol/L   Chloride 107 101 -  111 mmol/L   CO2 24 22 - 32 mmol/L   Glucose, Bld 128 (H) 65 - 99 mg/dL   BUN 10 6 - 20 mg/dL   Creatinine, Ser 1.08 0.61 - 1.24 mg/dL   Calcium 9.1 8.9 - 10.3 mg/dL   Total Protein 6.6 6.5 - 8.1 g/dL   Albumin 3.9 3.5 - 5.0 g/dL   AST 28 15 - 41 U/L   ALT 18 17 - 63 U/L   Alkaline Phosphatase 76 38 - 126 U/L   Total Bilirubin 0.9 0.3 - 1.2 mg/dL   GFR calc non Af Amer >60 >60 mL/min   GFR calc Af Amer >60 >60 mL/min   Anion gap 10 5 - 15  Ethanol (ETOH)  Result Value Ref Range  Alcohol, Ethyl (B) <5 <5 mg/dL  Salicylate level  Result Value Ref Range   Salicylate Lvl <3.3 2.8 - 30.0 mg/dL  Urine rapid drug screen (hosp performed)not at [facility]  Result Value Ref Range   Opiates NONE DETECTED NONE DETECTED   Cocaine POSITIVE (A) NONE DETECTED   Benzodiazepines POSITIVE (A) NONE DETECTED   Amphetamines NONE DETECTED NONE DETECTED   Tetrahydrocannabinol POSITIVE (A) NONE DETECTED   Barbiturates NONE DETECTED NONE DETECTED    1600:  IVC paperwork completed shortly after pt arrival due to pt stating he "was gonna tear this place up" and "get [place] here." Pt also stated he "didn't give a shit anymore" and "had enough valium at home." Pt sleeping on and off, will arouse to his name and speak with ED staff. Pt's nephew has left. Pt's VS remain stable. Will re-check 2nd ASA and APAP level. TTS to evaluate.         Francine Graven, DO 02/03/15 1610

## 2015-02-03 NOTE — BH Assessment (Addendum)
Assessment Note  Mark Crosby is an 59 y.o. male who presents to [facility] with his nephew. Per EDP documentation patient's nephew stated that he received a call from someone who stated that the patient "overdosed on pills and was drinking alcohol." Nephew found the patient with a "beer in his hand." EDP notes "Pt states he took 8 valium and 4 other pills at 1000am in [place]. Pt states he "can't go on" and "don't want to be here." Unclear what the 4 other pills were."   Upon assessment patient states that he called his nephew to bring him to the hospital because he "wasn't feeling good" and he took 4 valium. Patient states that the pills were not prescribed to him and he "got them off the street." Patient states that he wanted to "get checked out by a Doctor" after taking the 4 pills. Patient states that he lives with his cousin and enjoys working. Patient states that he does not abuse drugs or alcohol. Patient was very irritable and asked this writer "why you asking all these questions." Patient states that he has never had thoughts or feelings of harming himself or anyone else. When asked about passive thoughts patient raised his voice and stated "I would never hurt myself, i go 39 beautiful grandkids and a baby on the way, why would I hurt myself." When asked if he ever made statements about "not wanting to be here" patient responded "I don't want to be here in this hospital, I'm ready to go home!" patient states that he is not depressed and does not identify any symptoms of depression. Patient states that he has "never" had a Psychiatrist "and I won't because i don't need to." Patient states that he has never been to counseling and is not interested in receiving counseling.  Patient was IVC'd by the EDP due to initial complaint and patients behavior. Patient denies SI/HI and psychosis to this Probation officer. Patient denies previous MH and [place] history or treatment. Patient denies history of abuse and legal involvement.  Patient tested positive for cocaine, benzodiazepines, and THC.   Consulted with Waylan Boga, DNP who stated patient is to be observed overnight and re-evaluated in the morning by Psychiatry.   Axis I: Depressive Disorder NOS Axis II: Deferred Axis III:  Past Medical History  Diagnosis Date  . Allergy   . Arthritis   . COPD (chronic obstructive pulmonary disease)   . Shortness of breath dyspnea   . Numbness of fingers     left index finger and thumb numbness  . Muscle spasms of lower extremity     spasms in lower back for couple weeks  . Hx of adenomatous colonic polyps 12/12/2014  . Chronic neck pain   . Diverticulosis    Axis IV: other psychosocial or environmental problems, problems related to social environment and problems with primary support group Axis V: 51-60 moderate symptoms  Past Medical History:  Past Medical History  Diagnosis Date  . Allergy   . Arthritis   . COPD (chronic obstructive pulmonary disease)   . Shortness of breath dyspnea   . Numbness of fingers     left index finger and thumb numbness  . Muscle spasms of lower extremity     spasms in lower back for couple weeks  . Hx of adenomatous colonic polyps 12/12/2014  . Chronic neck pain   . Diverticulosis     Past Surgical History  Procedure Laterality Date  . Toenail excision  ingrown toenail removed  . Colonoscopy      Family History:  Family History  Problem Relation Age of Onset  . Heart disease Mother     MI  . Stroke Sister   . Lupus Sister     Social History:  reports that he quit smoking about 2 months ago. His smoking use included Pipe. He has quit using smokeless tobacco. His smokeless tobacco use included Chew. He reports that he drinks about 0.6 oz of alcohol per week. He reports that he does not use illicit drugs.  Additional Social History:  Alcohol / Drug Use Pain Medications: See MAR Prescriptions: See MAR Over the Counter: See MAR History of alcohol / drug use?: No  history of alcohol / drug abuse  CIWA: CIWA-Ar BP: 125/81 mmHg Pulse Rate: 96 COWS:    Allergies:  Allergies  Allergen Reactions  . Percocet [Oxycodone-Acetaminophen] Itching    Itching with large dose of medication    Home Medications:  (Not in a hospital admission)  OB/GYN Status:  No LMP for male patient.  General Assessment Data Location of Assessment: [facility] TTS Assessment: In system Is this a Tele or Face-to-Face Assessment?: Face-to-Face Is this an Initial Assessment or a Re-assessment for this encounter?: Initial Assessment Marital status: Divorced Is patient pregnant?: No Pregnancy Status: No Living Arrangements: Other relatives (Cousin) Can pt return to current living arrangement?: Yes Admission Status: Involuntary Is patient capable of signing voluntary admission?: Yes Referral Source: Self/Family/Friend     Crisis Care Plan Living Arrangements: Other relatives Development worker, international aid) Name of Psychiatrist: None Name of Therapist: None  Education Status Is patient currently in school?: No Highest grade of school patient has completed: 3 years of college  Risk to self with the past 6 months Suicidal Ideation: No (4 valium blue tens) Has patient been a risk to self within the past 6 months prior to admission? : No Suicidal Intent: No Has patient had any suicidal intent within the past 6 months prior to admission? : No Is patient at risk for suicide?: No Suicidal Plan?: No Has patient had any suicidal plan within the past 6 months prior to admission? : No Access to Means: No What has been your use of drugs/alcohol within the last 12 months?: Denies Previous Attempts/Gestures: No How many times?: 0 Other Self Harm Risks: Denies Triggers for Past Attempts: None known Intentional Self Injurious Behavior: None Family Suicide History: Yes (Younger brother overdosed) Persecutory voices/beliefs?: No Depression: No Substance abuse history and/or treatment for substance  abuse?: No Suicide prevention information given to non-admitted patients: Not applicable  Risk to Others within the past 6 months Homicidal Ideation: No Does patient have any lifetime risk of violence toward others beyond the six months prior to admission? : No Thoughts of Harm to Others: No Current Homicidal Intent: No Current Homicidal Plan: No Access to Homicidal Means: No Identified Victim: Denies History of harm to others?: No Assessment of Violence: None Noted Violent Behavior Description: Denies Does patient have access to weapons?: No ("none that I would use to hurt myself or anyone else") Criminal Charges Pending?: No Does patient have a court date: No Is patient on probation?: No  Psychosis Hallucinations: None noted Delusions: None noted  Mental Status Report Appearance/Hygiene: In scrubs Eye Contact: Poor Motor Activity: Unable to assess (laying down) Speech: Logical/coherent, Loud Level of Consciousness: Alert, Irritable Mood: Angry, Irritable Affect: Angry, Irritable Anxiety Level: None Thought Processes: Coherent, Relevant Judgement: Unimpaired Orientation: Person, Place, Time Obsessive Compulsive Thoughts/Behaviors:  None  Cognitive Functioning Concentration: Normal Memory: Recent Intact, Remote Intact IQ: Average Insight: Poor Impulse Control: Good Appetite: Poor Sleep: Decreased Total Hours of Sleep:  ("depends") Vegetative Symptoms: None  ADLScreening [facility] Assessment Services) Patient's cognitive ability adequate to safely complete daily activities?: Yes Patient able to express need for assistance with ADLs?: Yes Independently performs ADLs?: Yes (appropriate for developmental age)  Prior Inpatient Therapy Prior Inpatient Therapy: No Prior Therapy Dates: N/A Prior Therapy Facilty/Provider(s): N/A Reason for Treatment: N/A  Prior Outpatient Therapy Prior Outpatient Therapy: No Prior Therapy Dates: N/A Prior Therapy Facilty/Provider(s):  N/A Reason for Treatment: N/A Does patient have an ACCT team?: No Does patient have Intensive In-House Services?  : No Does patient have [facility] services? : No Does patient have P4CC services?: No  ADL Screening (condition at time of admission) Patient's cognitive ability adequate to safely complete daily activities?: Yes Is the patient deaf or have difficulty hearing?: No Does the patient have difficulty seeing, even when wearing glasses/contacts?: No Does the patient have difficulty concentrating, remembering, or making decisions?: No Patient able to express need for assistance with ADLs?: Yes Does the patient have difficulty dressing or bathing?: No Independently performs ADLs?: Yes (appropriate for developmental age) Does the patient have difficulty walking or climbing stairs?: No       Abuse/Neglect Assessment (Assessment to be complete while patient is alone) Physical Abuse: Denies Verbal Abuse: Denies Sexual Abuse: Denies Exploitation of patient/patient's resources: Denies Self-Neglect: Denies     Regulatory affairs officer (For Healthcare) Does patient have an advance directive?: No    Additional Information 1:1 In Past 12 Months?: No CIRT Risk: No Elopement Risk: No Does patient have medical clearance?: Yes     Disposition:  Disposition Initial Assessment Completed for this Encounter: Yes Disposition of Patient: Inpatient treatment program Type of inpatient treatment program: Adult  On Site Evaluation by:   Reviewed with Physician:    Shahzad Thomann 02/03/2015 5:36 PM

## 2015-02-04 DIAGNOSIS — F322 Major depressive disorder, single episode, severe without psychotic features: Secondary | ICD-10-CM | POA: Diagnosis not present

## 2015-02-04 MED ORDER — HYDROXYZINE HCL 25 MG PO TABS: 50.0000 mg | ORAL_TABLET | Freq: Three times a day (TID) | ORAL | Status: DC | PRN

## 2015-02-04 MED ORDER — TRAZODONE HCL 50 MG PO TABS
50.0000 mg | ORAL_TABLET | Freq: Every day | ORAL | Status: DC
  Filled 2015-02-04: qty 1

## 2015-02-04 NOTE — ED Notes (Signed)
Patient denies thoughts of harm to self or others.  Has been pleasant and cooperative throughout the shift.  Appetite is good.

## 2015-02-04 NOTE — Consult Note (Signed)
[facility] Consult   Reason for Consult:  Major depressive disorder, single episode severe Referring Physician:  EDP Patient Identification: Mark Crosby MRN:  [id number] Principal Diagnosis: Major depressive disorder, single episode, severe without psychosis Diagnosis:   Patient Active Problem List   Diagnosis Date Noted  . Major depressive disorder, single episode, severe without psychosis [F32.2] 02/04/2015  . Hx of adenomatous colonic polyps [Z86.010] 12/12/2014  . Influenza A [J10.1] 11/07/2014  . Chronic obstructive pulmonary disease with acute exacerbation [J44.1]   . COPD (chronic obstructive pulmonary disease) [J44.9] 11/04/2014  . COPD exacerbation [J44.1] 11/04/2014  . Viral syndrome [B34.9] 11/04/2014  . Tobacco abuse [Z72.0] 11/04/2014  . Bilateral arm pain [M79.601, M79.602] 08/28/2014  . Neck pain of over 3 months duration [M54.2, G89.29] 08/28/2014  . History of recurrent pneumonia [Z87.01] 08/28/2014  . History of substance abuse [Z87.898] 08/28/2014    Total Time spent with patient: 1 hour  Subjective:   Mark Crosby is a 59 y.o. male patient admitted with Single Episode Major depressive disorder.  HPI: AA male, 59 years old was evaluated for ingesting 8 tablets of Valium yesterday.  Patient was IVC by his nephew who also stated that patient had made comments about feeling suicidal and wanting to end his life.  Patient today denied wanting to kill himself and stated that he was stressed out and bought Valium off the street.  He also stated that he only took 2 tablets of his Valium and the rest are in his car.  He reported taking the Valium to help him relax as he was gioing through some stress he did not want to discuss.  He repeatedly stated that he did not want to kill himself.  Patient denied ever been seen by a Psychiatrist and denied feeling depressed.  Patient was angry and irritable during the interview.  Patient became loud and stated that  he has young children and that he has a good job and cannot kill himself.  We have accepted patient for admission and will be seeking placement at any facility with available beds.  HPI Elements:   Location:  Major depressive disorder, single episode, . Quality:  severe, . Severity:  severe. Timing:  Acute. Duration:  Sudden. Context:  IVC by nephew after ingesting Valium.  Past Medical History:  Past Medical History  Diagnosis Date  . Allergy   . Arthritis   . COPD (chronic obstructive pulmonary disease)   . Shortness of breath dyspnea   . Numbness of fingers     left index finger and thumb numbness  . Muscle spasms of lower extremity     spasms in lower back for couple weeks  . Hx of adenomatous colonic polyps 12/12/2014  . Chronic neck pain   . Diverticulosis     Past Surgical History  Procedure Laterality Date  . Toenail excision      ingrown toenail removed  . Colonoscopy     Family History:  Family History  Problem Relation Age of Onset  . Heart disease Mother     MI  . Stroke Sister   . Lupus Sister    Social History:  History  Alcohol Use  . 0.6 oz/week  . 1 Cans of beer, 0 Standard drinks or equivalent per week    Comment: Occasional     History  Drug Use No    History   Social History  . Marital Status: Single    Spouse Name: N/A  .  Number of Children: N/A  . Years of Education: N/A   Occupational History  . Janitor    Social History Main Topics  . Smoking status: Former Smoker -- 61 years    Types: Pipe    Quit date: 11/04/2014  . Smokeless tobacco: Former Systems developer    Types: Chew  . Alcohol Use: 0.6 oz/week    1 Cans of beer, 0 Standard drinks or equivalent per week     Comment: Occasional  . Drug Use: No  . Sexual Activity: Not on file   Other Topics Concern  . None   Social History Narrative   Married. Education [employer]   Exercise every day 1-2 hours.   Additional Social History:    Pain Medications: See MAR Prescriptions: See  MAR Over the Counter: See MAR History of alcohol / drug use?: No history of alcohol / drug abuse                     Allergies:   Allergies  Allergen Reactions  . Percocet [Oxycodone-Acetaminophen] Itching    Itching with large dose of medication    Labs:  Results for orders placed or performed during the hospital encounter of 02/03/15 (from the past 48 hour(s))  Urine rapid drug screen (hosp performed)not at [facility]     Status: Abnormal   Collection Time: 02/03/15 12:37 PM  Result Value Ref Range   Opiates NONE DETECTED NONE DETECTED   Cocaine POSITIVE (A) NONE DETECTED   Benzodiazepines POSITIVE (A) NONE DETECTED   Amphetamines NONE DETECTED NONE DETECTED   Tetrahydrocannabinol POSITIVE (A) NONE DETECTED   Barbiturates NONE DETECTED NONE DETECTED    Comment:        DRUG SCREEN FOR MEDICAL PURPOSES ONLY.  IF CONFIRMATION IS NEEDED FOR ANY PURPOSE, NOTIFY LAB WITHIN 5 DAYS.        LOWEST DETECTABLE LIMITS FOR URINE DRUG SCREEN Drug Class       Cutoff (ng/mL) Amphetamine      1000 Barbiturate      200 Benzodiazepine   599 Tricyclics       357 Opiates          300 Cocaine          300 THC              50   CBC     Status: None   Collection Time: 02/03/15 12:59 PM  Result Value Ref Range   WBC 7.8 4.0 - 10.5 K/uL   RBC 4.93 4.22 - 5.81 MIL/uL   Hemoglobin 15.8 13.0 - 17.0 g/dL   HCT 47.9 39.0 - 52.0 %   MCV 97.2 78.0 - 100.0 fL   MCH 32.0 26.0 - 34.0 pg   MCHC 33.0 30.0 - 36.0 g/dL   RDW 12.7 11.5 - 15.5 %   Platelets 280 150 - 400 K/uL  Comprehensive metabolic panel     Status: Abnormal   Collection Time: 02/03/15 12:59 PM  Result Value Ref Range   Sodium 141 135 - 145 mmol/L   Potassium 3.7 3.5 - 5.1 mmol/L   Chloride 107 101 - 111 mmol/L   CO2 24 22 - 32 mmol/L   Glucose, Bld 128 (H) 65 - 99 mg/dL   BUN 10 6 - 20 mg/dL   Creatinine, Ser 1.08 0.61 - 1.24 mg/dL   Calcium 9.1 8.9 - 10.3 mg/dL   Total Protein 6.6 6.5 - 8.1 g/dL   Albumin 3.9 3.5 - 5.0  g/dL   AST 28 15 - 41 U/L   ALT 18 17 - 63 U/L   Alkaline Phosphatase 76 38 - 126 U/L   Total Bilirubin 0.9 0.3 - 1.2 mg/dL   GFR calc non Af Amer >60 >60 mL/min   GFR calc Af Amer >60 >60 mL/min    Comment: (NOTE) The eGFR has been calculated using the CKD EPI equation. This calculation has not been validated in all clinical situations. eGFR's persistently <60 mL/min signify possible Chronic Kidney Disease.    Anion gap 10 5 - 15  Acetaminophen level     Status: Abnormal   Collection Time: 02/03/15  2:44 PM  Result Value Ref Range   Acetaminophen (Tylenol), Serum <10 (L) 10 - 30 ug/mL    Comment:        THERAPEUTIC CONCENTRATIONS VARY SIGNIFICANTLY. A RANGE OF 10-30 ug/mL MAY BE AN EFFECTIVE CONCENTRATION FOR MANY PATIENTS. HOWEVER, SOME ARE BEST TREATED AT CONCENTRATIONS OUTSIDE THIS RANGE. ACETAMINOPHEN CONCENTRATIONS >150 ug/mL AT 4 HOURS AFTER INGESTION AND >50 ug/mL AT 12 HOURS AFTER INGESTION ARE OFTEN ASSOCIATED WITH TOXIC REACTIONS.   Ethanol (ETOH)     Status: None   Collection Time: 02/03/15  2:44 PM  Result Value Ref Range   Alcohol, Ethyl (B) <5 <5 mg/dL    Comment:        LOWEST DETECTABLE LIMIT FOR SERUM ALCOHOL IS 5 mg/dL FOR MEDICAL PURPOSES ONLY   Salicylate level     Status: None   Collection Time: 02/03/15  2:44 PM  Result Value Ref Range   Salicylate Lvl <5.2 2.8 - 30.0 mg/dL  Acetaminophen level     Status: Abnormal   Collection Time: 02/03/15  5:20 PM  Result Value Ref Range   Acetaminophen (Tylenol), Serum <10 (L) 10 - 30 ug/mL    Comment:        THERAPEUTIC CONCENTRATIONS VARY SIGNIFICANTLY. A RANGE OF 10-30 ug/mL MAY BE AN EFFECTIVE CONCENTRATION FOR MANY PATIENTS. HOWEVER, SOME ARE BEST TREATED AT CONCENTRATIONS OUTSIDE THIS RANGE. ACETAMINOPHEN CONCENTRATIONS >150 ug/mL AT 4 HOURS AFTER INGESTION AND >50 ug/mL AT 12 HOURS AFTER INGESTION ARE OFTEN ASSOCIATED WITH TOXIC REACTIONS.   Salicylate level     Status: None    Collection Time: 02/03/15  5:20 PM  Result Value Ref Range   Salicylate Lvl <7.7 2.8 - 30.0 mg/dL    Vitals: Blood pressure 130/88, pulse 88, temperature 98.2 F (36.8 C), temperature source Oral, resp. rate 20, SpO2 100 %.  Risk to Self: Suicidal Ideation: No (4 valium blue tens) Suicidal Intent: No Is patient at risk for suicide?: No Suicidal Plan?: No Access to Means: No What has been your use of drugs/alcohol within the last 12 months?: Denies How many times?: 0 Other Self Harm Risks: Denies Triggers for Past Attempts: None known Intentional Self Injurious Behavior: None Risk to Others: Homicidal Ideation: No Thoughts of Harm to Others: No Current Homicidal Intent: No Current Homicidal Plan: No Access to Homicidal Means: No Identified Victim: Denies History of harm to others?: No Assessment of Violence: None Noted Violent Behavior Description: Denies Does patient have access to weapons?: No ("none that I would use to hurt myself or anyone else") Criminal Charges Pending?: No Does patient have a court date: No Prior Inpatient Therapy: Prior Inpatient Therapy: No Prior Therapy Dates: N/A Prior Therapy Facilty/Provider(s): N/A Reason for Treatment: N/A Prior Outpatient Therapy: Prior Outpatient Therapy: No Prior Therapy Dates: N/A Prior Therapy Facilty/Provider(s): N/A Reason for Treatment: N/A Does  patient have an ACCT team?: No Does patient have Intensive In-House Services?  : No Does patient have [facility] services? : No Does patient have P4CC services?: No  Current Facility-Administered Medications  Medication Dose Route Frequency Provider Last Rate Last Dose  . acetaminophen (TYLENOL) tablet 650 mg  650 mg Oral Q4H PRN Blanchie Dessert, MD      . albuterol (PROVENTIL HFA;VENTOLIN HFA) 108 (90 BASE) MCG/ACT inhaler 2 puff  2 puff Inhalation Q6H PRN Blanchie Dessert, MD      . alum & mag hydroxide-simeth (MAALOX/MYLANTA) 200-200-20 MG/5ML suspension 30 mL  30 mL Oral  PRN Blanchie Dessert, MD      . ibuprofen (ADVIL,MOTRIN) tablet 600 mg  600 mg Oral Q8H PRN Blanchie Dessert, MD      . nicotine (NICODERM CQ - dosed in mg/24 hours) patch 21 mg  21 mg Transdermal Daily Blanchie Dessert, MD   21 mg at 02/03/15 1704  . ondansetron (ZOFRAN) tablet 4 mg  4 mg Oral Q8H PRN Blanchie Dessert, MD       Current Outpatient Prescriptions  Medication Sig Dispense Refill  . Diazepam (VALIUM PO) Take 1 tablet by mouth once.    Marland Kitchen albuterol (PROVENTIL HFA;VENTOLIN HFA) 108 (90 BASE) MCG/ACT inhaler Inhale 2 puffs into the lungs every 6 (six) hours as needed for wheezing or shortness of breath.    . gabapentin (NEURONTIN) 100 MG capsule Take 1 capsule (100 mg total) by mouth 3 (three) times daily. (Patient not taking: Reported on 11/04/2014) 90 capsule 3  . guaiFENesin (ROBITUSSIN) 100 MG/5ML liquid Take 200 mg by mouth 3 (three) times daily as needed for cough.    . meloxicam (MOBIC) 7.5 MG tablet Take 1 tablet by mouth twice a day with meal or snack. (Patient not taking: Reported on 11/15/2014) 60 tablet 3    Musculoskeletal: Strength & Muscle Tone: within normal limits Gait & Station: normal Patient leans: N/A  Psychiatric Specialty Exam: Physical Exam  Review of Systems  Constitutional: Negative.   HENT: Negative.   Eyes: Negative.   Respiratory:       Hx of COPD, USES HIS INHALERS  Cardiovascular: Negative.   Gastrointestinal: Negative.   Genitourinary: Negative.   Musculoskeletal: Negative.   Skin: Negative.   Neurological: Negative.   Endo/Heme/Allergies: Negative.     Blood pressure 130/88, pulse 88, temperature 98.2 F (36.8 C), temperature source Oral, resp. rate 20, SpO2 100 %.There is no weight on file to calculate BMI.  General Appearance: Casual and Disheveled  Eye Contact::  Minimal  Speech:  Clear and Coherent and Pressured  Volume:  Normal  Mood:  Angry, Depressed and Irritable  Affect:  Congruent, Depressed and Flat  Thought Process:   Coherent, Goal Directed and Intact  Orientation:  Full (Time, Place, and Person)  Thought Content:  WDL  Suicidal Thoughts:  No  Homicidal Thoughts:  No  Memory:  Immediate;   Good Recent;   Good Remote;   Good  Judgement:  Poor  Insight:  Lacking and Shallow  Psychomotor Activity:  Normal  Concentration:  Good  Recall:  NA  Fund of Knowledge:Fair  Language: Good  Akathisia:  NA  Handed:  Right  AIMS (if indicated):     Assets:  Desire for Improvement  ADL's:  Impaired  Cognition: WNL  Sleep:      Medical Decision Making: Review of Psycho-Social Stressors (1), Established Problem, Worsening (2), Review of Medication Regimen & Side Effects (2) and Review of New Medication  or Change in Dosage (2)  Treatment Plan Summary: Daily contact with patient to assess and evaluate symptoms and progress in treatment and Medication management  Plan:  We will use Vistaril 50 mg po every 8 hours as needed for anxiety, Trazodone 50 mg po at bed time for sleep.  Disposition:  Admit and seek placement at any facility with inpatient Psychiatric unit  Delfin Gant   PMHNP-BC 02/04/2015 5:42 PM Patient seen face-to-face for psychiatric evaluation, chart reviewed and case discussed with the physician extender and developed treatment plan. Reviewed the information documented and agree with the treatment plan. Corena Pilgrim, MD

## 2015-02-05 ENCOUNTER — Encounter (HOSPITAL_COMMUNITY): Payer: Self-pay | Admitting: Behavioral Health

## 2015-02-05 ENCOUNTER — Inpatient Hospital Stay (HOSPITAL_COMMUNITY)
Admission: EM | Admit: 2015-02-05 | Discharge: 2015-02-06 | DRG: 882 | Disposition: A | Discharge status: Home / Self Care | Payer: Federal, State, Local not specified - Other | Source: Intra-hospital | Attending: Psychiatry | Admitting: Psychiatry

## 2015-02-05 DIAGNOSIS — J449 Chronic obstructive pulmonary disease, unspecified: Secondary | ICD-10-CM | POA: Diagnosis present

## 2015-02-05 DIAGNOSIS — F1994 Other psychoactive substance use, unspecified with psychoactive substance-induced mood disorder: Secondary | ICD-10-CM | POA: Diagnosis present

## 2015-02-05 DIAGNOSIS — F4323 Adjustment disorder with mixed anxiety and depressed mood: Secondary | ICD-10-CM | POA: Diagnosis not present

## 2015-02-05 DIAGNOSIS — F1010 Alcohol abuse, uncomplicated: Secondary | ICD-10-CM | POA: Diagnosis not present

## 2015-02-05 DIAGNOSIS — F17200 Nicotine dependence, unspecified, uncomplicated: Secondary | ICD-10-CM | POA: Diagnosis present

## 2015-02-05 DIAGNOSIS — Z87891 Personal history of nicotine dependence: Secondary | ICD-10-CM

## 2015-02-05 DIAGNOSIS — Z720 Tobacco use: Secondary | ICD-10-CM | POA: Diagnosis not present

## 2015-02-05 DIAGNOSIS — F1220 Cannabis dependence, uncomplicated: Secondary | ICD-10-CM | POA: Diagnosis present

## 2015-02-05 MED ORDER — GUAIFENESIN 100 MG/5ML PO SYRP
200.0000 mg | ORAL_SOLUTION | ORAL | Status: DC | PRN
  Administered 2015-02-05: 200 mg via ORAL
  Filled 2015-02-05 (×2): qty 10

## 2015-02-05 MED ORDER — MAGNESIUM HYDROXIDE 400 MG/5ML PO SUSP: 30.0000 mL | Freq: Every day | ORAL | Status: DC | PRN

## 2015-02-05 MED ORDER — TRAZODONE HCL 50 MG PO TABS: 50.0000 mg | ORAL_TABLET | Freq: Every evening | ORAL | Status: DC | PRN

## 2015-02-05 MED ORDER — HYDROXYZINE HCL 25 MG PO TABS: 25.0000 mg | ORAL_TABLET | Freq: Four times a day (QID) | ORAL | Status: DC | PRN

## 2015-02-05 MED ORDER — ALUM & MAG HYDROXIDE-SIMETH 200-200-20 MG/5ML PO SUSP: 30.0000 mL | ORAL | Status: DC | PRN

## 2015-02-05 MED ORDER — ALBUTEROL SULFATE HFA 108 (90 BASE) MCG/ACT IN AERS
2.0000 | INHALATION_SPRAY | Freq: Four times a day (QID) | RESPIRATORY_TRACT | Status: DC | PRN
  Administered 2015-02-05 – 2015-02-06 (×2): 2 via RESPIRATORY_TRACT
  Filled 2015-02-05: qty 6.7

## 2015-02-05 MED ORDER — MIRTAZAPINE 7.5 MG PO TABS
7.5000 mg | ORAL_TABLET | Freq: Every day | ORAL | Status: DC
  Filled 2015-02-05 (×3): qty 1

## 2015-02-05 MED ORDER — LORAZEPAM 1 MG PO TABS: 1.0000 mg | ORAL_TABLET | ORAL | Status: DC | PRN

## 2015-02-05 NOTE — [facility] Suicide Risk Assessment (Signed)
[facility] INPATIENT:  Family/Significant Other Suicide Prevention Education  Suicide Prevention Education:  Patient Refusal for Family/Significant Other Suicide Prevention Education: The patient Mark Crosby has refused to provide written consent for family/significant other to be provided Family/Significant Other Suicide Prevention Education during admission and/or prior to discharge.  Physician notified. SPE reviewed with patient and brochure provided. Patient encouraged to return to hospital if having suicidal thoughts, patient verbalized his/her understanding and has no further questions at this time.   Zayn Selley, Casimiro Needle 02/05/2015, 3:47 PM
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
## 2015-02-05 NOTE — Tx Team (Signed)
Interdisciplinary Treatment Plan Update (Adult) Date: 02/05/2015   Time Reviewed: 9:30 AM  Progress in Treatment: Attending groups: Continuing to assess, patient new to milieu Participating in groups: Continuing to assess, patient new to milieu Taking medication as prescribed: Yes Tolerating medication: Yes Family/Significant other contact made: No, CSW assessing for appropriate contacts Patient understands diagnosis: Yes Discussing patient identified problems/goals with staff: Yes Medical problems stabilized or resolved: Yes Denies suicidal/homicidal ideation: Yes Issues/concerns per patient self-inventory: Yes Other:  New problem(s) identified: N/A  Discharge Plan or Barriers:  6/21: CSW continuing to assess. Patient will likely return home to follow up with outpatient services.  Reason for Continuation of Hospitalization:  Depression Anxiety Medication Stabilization   Comments: N/A  Estimated length of stay: 3-5 days  For review of initial/current patient goals, please see plan of care.  Patient is a 59 year old Male admitted for depression and overdose. Patient lives in [place] with family. Patient will benefit from crisis stabilization, medication evaluation, group therapy, and psycho education in addition to case management for discharge planning. Patient and CSW reviewed pt's identified goals and treatment plan. Pt verbalized understanding and agreed to treatment plan.   Attendees: Patient:    Family:    Physician: Dr. Parke Poisson; Dr. Sabra Heck 02/05/2015 9:30 AM  Nursing: Markham Jordan, [address], [address], [place] , [place] 02/05/2015 9:30 AM  Clinical Social Worker: Tilden Fossa, Peri Maris  LCSWA 02/05/2015 9:30 AM  Other: Joette Catching, LCSW 02/05/2015 9:30 AM  Other: Lucinda Dell, Beverly Sessions Liaison 02/05/2015 9:30 AM  Other: Lars Pinks, Case Manager 02/05/2015 9:30 AM  Other: Agustina Caroli, NP 02/05/2015 9:30 AM  Other:      Scribe for Treatment  Team:  Tilden Fossa, MSW, [place] [phone]

## 2015-02-05 NOTE — Progress Notes (Signed)
Recreation Therapy Notes  Animal-Assisted Activity (AAA) Program Checklist/Progress Notes Patient Eligibility Criteria Checklist & Daily Group note for Rec Tx Intervention  Date: 06.21.16 Time: 2:30 pm Location: [address]  AAA/T Program Assumption of Risk Form signed by Patient/ or Parent Legal Guardian yes  Patient is free of allergies or sever asthma yes  Patient reports no fear of animals yes  Patient reports no history of cruelty to animalsyes  Patient understands his/her participation is voluntary yes  Patient washes hands before animal contact yes  Patient washes hands after animal contact yes  Education: Hand Washing, Appropriate Animal Interaction   Clinical Observations/Feedback: Patient did not attend group.   Victorino Sparrow, LRT/CTRS         Victorino Sparrow A 02/05/2015 4:14 PM

## 2015-02-05 NOTE — [facility] Suicide Risk Assessment (Signed)
[facility] Admission Suicide Risk Assessment   Nursing information obtained from:    Demographic factors:    Current Mental Status:    Loss Factors:    Historical Factors:    Risk Reduction Factors:    Total Time spent with patient: 30 minutes Principal Problem: Adjustment disorder with mixed anxiety and depressed mood Diagnosis:   Patient Active Problem List   Diagnosis Date Noted  . Cannabis use disorder, severe, dependence [F12.20] 02/05/2015  . Adjustment disorder with mixed anxiety and depressed mood [F43.23] 02/05/2015  . Alcohol use disorder, mild, abuse [F10.10] 02/05/2015  . Tobacco use disorder [Z72.0] 02/05/2015  . Hx of adenomatous colonic polyps [Z86.010] 12/12/2014  . Influenza A [J10.1] 11/07/2014  . COPD (chronic obstructive pulmonary disease) [J44.9] 11/04/2014  . Viral syndrome [B34.9] 11/04/2014  . Bilateral arm pain [M79.601, M79.602] 08/28/2014  . Neck pain of over 3 months duration [M54.2, G89.29] 08/28/2014  . History of recurrent pneumonia [Z87.01] 08/28/2014  . History of substance abuse [Z87.898] 08/28/2014     Continued Clinical Symptoms:  Alcohol Use Disorder Identification Test Final Score (AUDIT): 4 The "Alcohol Use Disorders Identification Test", Guidelines for Use in Primary Care, Second Edition.  World Pharmacologist [facility]). Score between 0-7:  no or low risk or alcohol related problems. Score between 8-15:  moderate risk of alcohol related problems. Score between 16-19:  high risk of alcohol related problems. Score 20 or above:  warrants further diagnostic evaluation for alcohol dependence and treatment.   CLINICAL FACTORS:   Alcohol/Substance Abuse/Dependencies   Musculoskeletal: Strength & Muscle Tone: within normal limits Gait & Station: normal Patient leans: N/A  Psychiatric Specialty Exam: Physical Exam  Review of Systems  Psychiatric/Behavioral: Positive for substance abuse. The patient has insomnia.   All other systems reviewed  and are negative.   Blood pressure 137/101, pulse 102, temperature 98.2 F (36.8 C), temperature source Oral, resp. rate 18, height 5\' 7"  (1.702 m), weight 64.411 kg (142 lb), SpO2 99 %.Body mass index is 22.24 kg/(m^2).              Please see H&P.                                            COGNITIVE FEATURES THAT CONTRIBUTE TO RISK:  Closed-mindedness, Polarized thinking and Thought constriction (tunnel vision)    SUICIDE RISK:   Mild:  Suicidal ideation of limited frequency, intensity, duration, and specificity.  There are no identifiable plans, no associated intent, mild dysphoria and related symptoms, good self-control (both objective and subjective assessment), few other risk factors, and identifiable protective factors, including available and accessible social support.  PLAN OF CARE: Please see H&P.   Medical Decision Making:  Review of Psycho-Social Stressors (1), Review or order clinical lab tests (1), Review of Last Therapy Session (1), Review of Medication Regimen & Side Effects (2) and Review of New Medication or Change in Dosage (2)  I certify that inpatient services furnished can reasonably be expected to improve the patient's condition.   Lemon Whitacre MD 02/05/2015, 12:27 PM

## 2015-02-05 NOTE — Progress Notes (Signed)
D: Pt presents irritable on approach. Pt persistent about being discharge today. Pt stated that he's not having depression nor suicidal thoughts. Pt stated that he would like to return back to work at [employer]. Pt compliant with attending groups this morning. Pt denies any withdrawal symptoms and refused prn meds when offered. Pt hygiene appropriate for pt and situation.  A: Medications administered as ordered per MD. Writer made pt aware of available meds. Verbal support given. Pt encouraged to attend groups. 15 minute checks performed for safety. R: Pt safety maintained at this time.

## 2015-02-05 NOTE — Progress Notes (Signed)
Nursing admission note: 59 y/o male who presents involuntarily s/p suspected OD.  Patient states he did not overdose.  Patient states he took 1 Valium he got from a friend and was drinking beer.  Patient states, "I just wanted to relax."  Patient states he has been dating a girl for the last 10 years and in March he told him the have a 69 year old child together that lives with someone else and she told him she was pregnant with another one of his children.  Patient states this woman is using crack cocaine and she is stressing him out.  Patient states he wants to be a father to his children but this woman is stressing him out.  Patient denies SI/HI and denies AVH.  Patient states he has 2 jobs and he works 70 hours a week and he has to get back to work.  Patient did become tearful talking about his unborn child.  Patient UDS was positive for cocaine and THC and benzos.  Patient denies drug use.  Patient states he drinks beers occasionally.  Patient states he is also opposed to taking medications and states he only took his friends Valium this  time trying to relax.  Patient skin assessed and patient has tattoos.  Consents obtained, fall safety plan explained and patient verbalized understanding.  Patient belongings secured in locker #20.  Food and fluids offered and patient accepted both. Patient escorted and oriented to the unit.  Patient offered no additional questions or concerns.

## 2015-02-05 NOTE — Progress Notes (Signed)
Adult Psychoeducational Group Note  Date:  02/05/2015 Time:  0900  Group Topic/Focus:  Recovery Goals:   The focus of this group is to identify appropriate goals for recovery and establish a plan to achieve them.  Participation Level:  Minimal  Participation Quality:  Appropriate  Affect:  Irritable  Cognitive:  Appropriate  Insight: Appropriate  Engagement in Group:  Lacking  Modes of Intervention:  Education  Additional Comments:    Mark Crosby L 02/05/2015, 9:22 AM

## 2015-02-05 NOTE — H&P (Signed)
Psychiatric Admission Assessment Adult  Patient Identification: Mark Crosby MRN:  [id number] Date of Evaluation:  02/05/2015 Chief Complaint: Patient states " I was worried about my child , my girl friend is pregnant again and she is still abusing cocaine and is till out in the streets. I want to know if this child will live.'    Principal Diagnosis: Adjustment disorder with mixed anxiety and depressed mood Diagnosis:   Patient Active Problem List   Diagnosis Date Noted  . Cannabis use disorder, severe, dependence [F12.20] 02/05/2015  . Adjustment disorder with mixed anxiety and depressed mood [F43.23] 02/05/2015  . Alcohol use disorder, mild, abuse [F10.10] 02/05/2015  . Tobacco use disorder [Z72.0] 02/05/2015  . Hx of adenomatous colonic polyps [Z86.010] 12/12/2014  . Influenza A [J10.1] 11/07/2014  . COPD (chronic obstructive pulmonary disease) [J44.9] 11/04/2014  . Viral syndrome [B34.9] 11/04/2014  . Bilateral arm pain [M79.601, M79.602] 08/28/2014  . Neck pain of over 3 months duration [M54.2, G89.29] 08/28/2014  . History of recurrent pneumonia [Z87.01] 08/28/2014  . History of substance abuse [Z87.898] 08/28/2014          History of Present Illness:: Mark Crosby is a 59 y.o. AA male, divorced  , employed , works in two churches as a Retail buyer , who presented to [facility] with his nephew. Per initial notes in EHR - Pt was brought in by nephew due to receiving a call from some one that pt was not doing well. Pt also apparently took a few pills of valium which he borrowed from a friend along with alcohol. Pt denied SI/HI [place] in the ED, but was admitted to [facility] due to his initial presentation in the ED - when there was some ? SI ."  Patient seen this AM. Pt appeared to be tearful , reported a lot going on between him and his girlfriend. Pt stated that his girlfriend already states that she has a 59 yr old child with him and now again claims to be pregnant with his child . Pt  states that his GF is always out in the streets , abuses cocaine , had tubectomy done  And hence he is afraid that this child will not live . Pt states he is willing to accept this child without any DNA confirmation , but he is anxious about her not changing her ways . Pt states that this stressed him out. Pt denies being depressed or anxious otherwise , he only reports situational anxiety due to these issues in his relationship.  Pt denies SI- reports "I love life.' Pt denies HI. Pt denies AH/VH/paranoia.  Pt reports smoking cannabis on and off , but denies cocaine abuse . He reports that his GF was smoking in the same room and he could have inhaled some- UDS positive for cocaine, THC,BZD. Pt reports taking 2 pills of valium that he borrowed from a friend- Denies abusing it - denies any withdrawal sx. Pt reports using alcohol occasionally - denies getting drunk or having withdrawal sx.  Pt reports that he is employed , is always surrounded by people in church and he has never felt depressed . Pt denies any sleep issues. 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  Pt reports this as his first admission to a mental health facility. Pt denies suicide attempts.  Pt denies being on any psychotropic medications in the past.  Pt was IVC ed by nephew-  hence his nephew was contacted with pt present in the office- Mark Crosby - per nephew , his sister called and asked nephew to get help for the pt since she felt he was not doing well. So nephew brought him to get checked out. Per him - he was concerned about him not eating well. He reports that he currently has no concerns about patient's safety or any one else's safety at this time - if patient be released from the hospital .     Elements:  Location:  anxiety, substance abuse. Quality:  see above. Severity:  severe. Timing:  acute. Duration:  past few days. Context:  hx of several  stressors in his relationaship, substance abuse. Associated Signs/Symptoms: Depression Symptoms:  anxiety, decreased appetite, (Hypo) Manic Symptoms:  Impulsivity, Anxiety Symptoms:  anxiety issues Psychotic Symptoms:  denies PTSD Symptoms: Negative Total Time spent with patient: 1 hour  Past Medical History:  Past Medical History  Diagnosis Date  . Allergy   . Arthritis   . COPD (chronic obstructive pulmonary disease)   . Shortness of breath dyspnea   . Numbness of fingers     left index finger and thumb numbness  . Muscle spasms of lower extremity     spasms in lower back for couple weeks  . Hx of adenomatous colonic polyps 12/12/2014  . Chronic neck pain   . Diverticulosis     Past Surgical History  Procedure Laterality Date  . Toenail excision      ingrown toenail removed  . Colonoscopy     Family History:  Family History  Problem Relation Age of Onset  . Heart disease Mother     MI  . Stroke Sister   . Lupus Sister    Social History:  History  Alcohol Use  . 0.6 oz/week  . 1 Cans of beer, 0 Standard drinks or equivalent per week    Comment: Occasional     History  Drug Use  . Yes  . Special: Marijuana    History   Social History  . Marital Status: Single    Spouse Name: N/A  . Number of Children: N/A  . Years of Education: N/A   Occupational History  . Janitor    Social History Main Topics  . Smoking status: Former Smoker -- 37 years    Types: Pipe    Quit date: 11/04/2014  . Smokeless tobacco: Former Systems developer    Types: Chew  . Alcohol Use: 0.6 oz/week    1 Cans of beer, 0 Standard drinks or equivalent per week     Comment: Occasional  . Drug Use: Yes    Special: Marijuana  . Sexual Activity: Yes   Other Topics Concern  . None   Social History Narrative   Married. Education [employer]   Exercise every day 1-2 hours.   Additional Social History:    Pain Medications: see mar Prescriptions: see pta med list History of alcohol / drug  use?: No history of alcohol / drug abuse Longest period of sobriety (when/how long): pt uds + but denies        Patient was born in [place]. Pt had a good childhood. Pt is divorced . Pt has 6 children , who are adults . Pt has one 19 yr old from his current girlfriend , one on the way. Pt works as a Retail buyer in two churches . Pt is religious. Pt has a dad who lives in [place] . Pt was in jail for  a short period of time years ago, for not testifying . Pt went to law school for three years - did not complete, did several different jobs .             Musculoskeletal: Strength & Muscle Tone: within normal limits Gait & Station: normal Patient leans: N/A  Psychiatric Specialty Exam: Physical Exam  Constitutional: He is oriented to person, place, and time. He appears well-developed and well-nourished.  HENT:  Head: Normocephalic and atraumatic.  Eyes: Conjunctivae and EOM are normal. Pupils are equal, round, and reactive to light.  Neck: Normal range of motion. Neck supple. No thyromegaly present.  Cardiovascular: Normal rate and regular rhythm.   Respiratory: Effort normal and breath sounds normal.  GI: Soft.  Musculoskeletal:  Limited ROM of his right wrist - due to hx of injury years ago  Neurological: He is alert and oriented to person, place, and time.  Skin: Skin is warm and dry.  Psychiatric: His speech is normal and behavior is normal. Thought content normal. His mood appears anxious. His affect is labile. Cognition and memory are normal. He expresses impulsivity.    Review of Systems  Psychiatric/Behavioral: Positive for substance abuse. The patient is nervous/anxious.   All other systems reviewed and are negative.   Blood pressure 137/101, pulse 102, temperature 98.2 F (36.8 C), temperature source Oral, resp. rate 18, height 5\' 7"  (1.702 m), weight 64.411 kg (142 lb), SpO2 99 %.Body mass index is 22.24 kg/(m^2).  General Appearance: Casual  Eye Contact::  Good  Speech:  Normal  Rate  Volume:  Normal  Mood:  Anxious  Affect:  Tearful  Thought Process:  Logical  Orientation:  Full (Time, Place, and Person)  Thought Content:  Rumination  Suicidal Thoughts:  No  Homicidal Thoughts:  No  Memory:  Immediate;   Fair Recent;   Fair Remote;   Fair  Judgement:  Impaired  Insight:  Shallow  Psychomotor Activity:  Normal  Concentration:  Fair  Recall:  [phone] of Knowledge:Fair  Language: Fair  Akathisia:  No  Handed:  Right  AIMS (if indicated):     Assets:  Communication Skills Desire for Improvement Social Support Talents/Skills  ADL's:  Intact  Cognition: WNL  Sleep:      Risk to Self: Is patient at risk for suicide?: Yes Risk to Others:  denies Prior Inpatient Therapy:  denies Prior Outpatient Therapy:  denies  Alcohol Screening: 1. How often do you have a drink containing alcohol?: 2 to 3 times a week 2. How many drinks containing alcohol do you have on a typical day when you are drinking?: 3 or 4 3. How often do you have six or more drinks on one occasion?: Never Preliminary Score: 1 9. Have you or someone else been injured as a result of your drinking?: No 10. Has a relative or friend or a doctor or another health worker been concerned about your drinking or suggested you cut down?: No Alcohol Use Disorder Identification Test Final Score (AUDIT): 4 Brief Intervention: Patient declined brief intervention  Allergies:   Allergies  Allergen Reactions  . Percocet [Oxycodone-Acetaminophen] Itching    Itching with large dose of medication   Lab Results:  Results for orders placed or performed during the hospital encounter of 02/03/15 (from the past 48 hour(s))  Urine rapid drug screen (hosp performed)not at [facility]     Status: Abnormal   Collection Time: 02/03/15 12:37 PM  Result Value Ref Range  Opiates NONE DETECTED NONE DETECTED   Cocaine POSITIVE (A) NONE DETECTED   Benzodiazepines POSITIVE (A) NONE DETECTED   Amphetamines NONE DETECTED  NONE DETECTED   Tetrahydrocannabinol POSITIVE (A) NONE DETECTED   Barbiturates NONE DETECTED NONE DETECTED    Comment:        DRUG SCREEN FOR MEDICAL PURPOSES ONLY.  IF CONFIRMATION IS NEEDED FOR ANY PURPOSE, NOTIFY LAB WITHIN 5 DAYS.        LOWEST DETECTABLE LIMITS FOR URINE DRUG SCREEN Drug Class       Cutoff (ng/mL) Amphetamine      1000 Barbiturate      200 Benzodiazepine   676 Tricyclics       720 Opiates          300 Cocaine          300 THC              50   CBC     Status: None   Collection Time: 02/03/15 12:59 PM  Result Value Ref Range   WBC 7.8 4.0 - 10.5 K/uL   RBC 4.93 4.22 - 5.81 MIL/uL   Hemoglobin 15.8 13.0 - 17.0 g/dL   HCT 47.9 39.0 - 52.0 %   MCV 97.2 78.0 - 100.0 fL   MCH 32.0 26.0 - 34.0 pg   MCHC 33.0 30.0 - 36.0 g/dL   RDW 12.7 11.5 - 15.5 %   Platelets 280 150 - 400 K/uL  Comprehensive metabolic panel     Status: Abnormal   Collection Time: 02/03/15 12:59 PM  Result Value Ref Range   Sodium 141 135 - 145 mmol/L   Potassium 3.7 3.5 - 5.1 mmol/L   Chloride 107 101 - 111 mmol/L   CO2 24 22 - 32 mmol/L   Glucose, Bld 128 (H) 65 - 99 mg/dL   BUN 10 6 - 20 mg/dL   Creatinine, Ser 1.08 0.61 - 1.24 mg/dL   Calcium 9.1 8.9 - 10.3 mg/dL   Total Protein 6.6 6.5 - 8.1 g/dL   Albumin 3.9 3.5 - 5.0 g/dL   AST 28 15 - 41 U/L   ALT 18 17 - 63 U/L   Alkaline Phosphatase 76 38 - 126 U/L   Total Bilirubin 0.9 0.3 - 1.2 mg/dL   GFR calc non Af Amer >60 >60 mL/min   GFR calc Af Amer >60 >60 mL/min    Comment: (NOTE) The eGFR has been calculated using the CKD EPI equation. This calculation has not been validated in all clinical situations. eGFR's persistently <60 mL/min signify possible Chronic Kidney Disease.    Anion gap 10 5 - 15  Acetaminophen level     Status: Abnormal   Collection Time: 02/03/15  2:44 PM  Result Value Ref Range   Acetaminophen (Tylenol), Serum <10 (L) 10 - 30 ug/mL    Comment:        THERAPEUTIC CONCENTRATIONS  VARY SIGNIFICANTLY. A RANGE OF 10-30 ug/mL MAY BE AN EFFECTIVE CONCENTRATION FOR MANY PATIENTS. HOWEVER, SOME ARE BEST TREATED AT CONCENTRATIONS OUTSIDE THIS RANGE. ACETAMINOPHEN CONCENTRATIONS >150 ug/mL AT 4 HOURS AFTER INGESTION AND >50 ug/mL AT 12 HOURS AFTER INGESTION ARE OFTEN ASSOCIATED WITH TOXIC REACTIONS.   Ethanol (ETOH)     Status: None   Collection Time: 02/03/15  2:44 PM  Result Value Ref Range   Alcohol, Ethyl (B) <5 <5 mg/dL    Comment:        LOWEST DETECTABLE LIMIT FOR SERUM ALCOHOL IS 5 mg/dL FOR  MEDICAL PURPOSES ONLY   Salicylate level     Status: None   Collection Time: 02/03/15  2:44 PM  Result Value Ref Range   Salicylate Lvl <3.1 2.8 - 30.0 mg/dL  Acetaminophen level     Status: Abnormal   Collection Time: 02/03/15  5:20 PM  Result Value Ref Range   Acetaminophen (Tylenol), Serum <10 (L) 10 - 30 ug/mL    Comment:        THERAPEUTIC CONCENTRATIONS VARY SIGNIFICANTLY. A RANGE OF 10-30 ug/mL MAY BE AN EFFECTIVE CONCENTRATION FOR MANY PATIENTS. HOWEVER, SOME ARE BEST TREATED AT CONCENTRATIONS OUTSIDE THIS RANGE. ACETAMINOPHEN CONCENTRATIONS >150 ug/mL AT 4 HOURS AFTER INGESTION AND >50 ug/mL AT 12 HOURS AFTER INGESTION ARE OFTEN ASSOCIATED WITH TOXIC REACTIONS.   Salicylate level     Status: None   Collection Time: 02/03/15  5:20 PM  Result Value Ref Range   Salicylate Lvl <4.9 2.8 - 30.0 mg/dL   Current Medications: Current Facility-Administered Medications  Medication Dose Route Frequency Provider Last Rate Last Dose  . albuterol (PROVENTIL HFA;VENTOLIN HFA) 108 (90 BASE) MCG/ACT inhaler 2 puff  2 puff Inhalation Q6H PRN Laverle Hobby, PA-C      . alum & mag hydroxide-simeth (MAALOX/MYLANTA) 200-200-20 MG/5ML suspension 30 mL  30 mL Oral Q4H PRN Laverle Hobby, PA-C      . guaifenesin (ROBITUSSIN) 100 MG/5ML syrup 200 mg  200 mg Oral Q4H PRN Ursula Alert, MD      . hydrOXYzine (ATARAX/VISTARIL) tablet 25 mg  25 mg Oral Q6H PRN  Laverle Hobby, PA-C      . LORazepam (ATIVAN) tablet 1 mg  1 mg Oral Q4H PRN Ursula Alert, MD      . magnesium hydroxide (MILK OF MAGNESIA) suspension 30 mL  30 mL Oral Daily PRN Laverle Hobby, PA-C      . mirtazapine (REMERON) tablet 7.5 mg  7.5 mg Oral QHS Kristl Morioka, MD       PTA Medications: Prescriptions prior to admission  Medication Sig Dispense Refill Last Dose  . albuterol (PROVENTIL HFA;VENTOLIN HFA) 108 (90 BASE) MCG/ACT inhaler Inhale 2 puffs into the lungs every 6 (six) hours as needed for wheezing or shortness of breath.   Past Month at Unknown time  . Diazepam (VALIUM PO) Take 1 tablet by mouth once.   Past Week at Unknown time  . guaiFENesin (ROBITUSSIN) 100 MG/5ML liquid Take 200 mg by mouth 3 (three) times daily as needed for cough.   Past Month at Unknown time  . gabapentin (NEURONTIN) 100 MG capsule Take 1 capsule (100 mg total) by mouth 3 (three) times daily. (Patient not taking: Reported on 11/04/2014) 90 capsule 3 Unknown at Unknown time  . meloxicam (MOBIC) 7.5 MG tablet Take 1 tablet by mouth twice a day with meal or snack. (Patient not taking: Reported on 11/15/2014) 60 tablet 3 Unknown at Unknown time    Previous Psychotropic Medications: No   Substance Abuse History in the last 12 months:  Yes.  cannabis , unspecified BZD abuse - pt denies -UDS positive , unspecified cocaine abuse - pt denies - UDS Positive, tobacco abuse    Consequences of Substance Abuse: Medical Consequences:  recent admission  Results for orders placed or performed during the hospital encounter of 02/03/15 (from the past 72 hour(s))  Urine rapid drug screen (hosp performed)not at [facility]     Status: Abnormal   Collection Time: 02/03/15 12:37 PM  Result Value Ref Range   Opiates  NONE DETECTED NONE DETECTED   Cocaine POSITIVE (A) NONE DETECTED   Benzodiazepines POSITIVE (A) NONE DETECTED   Amphetamines NONE DETECTED NONE DETECTED   Tetrahydrocannabinol POSITIVE (A) NONE DETECTED    Barbiturates NONE DETECTED NONE DETECTED    Comment:        DRUG SCREEN FOR MEDICAL PURPOSES ONLY.  IF CONFIRMATION IS NEEDED FOR ANY PURPOSE, NOTIFY LAB WITHIN 5 DAYS.        LOWEST DETECTABLE LIMITS FOR URINE DRUG SCREEN Drug Class       Cutoff (ng/mL) Amphetamine      1000 Barbiturate      200 Benzodiazepine   169 Tricyclics       678 Opiates          300 Cocaine          300 THC              50   CBC     Status: None   Collection Time: 02/03/15 12:59 PM  Result Value Ref Range   WBC 7.8 4.0 - 10.5 K/uL   RBC 4.93 4.22 - 5.81 MIL/uL   Hemoglobin 15.8 13.0 - 17.0 g/dL   HCT 47.9 39.0 - 52.0 %   MCV 97.2 78.0 - 100.0 fL   MCH 32.0 26.0 - 34.0 pg   MCHC 33.0 30.0 - 36.0 g/dL   RDW 12.7 11.5 - 15.5 %   Platelets 280 150 - 400 K/uL  Comprehensive metabolic panel     Status: Abnormal   Collection Time: 02/03/15 12:59 PM  Result Value Ref Range   Sodium 141 135 - 145 mmol/L   Potassium 3.7 3.5 - 5.1 mmol/L   Chloride 107 101 - 111 mmol/L   CO2 24 22 - 32 mmol/L   Glucose, Bld 128 (H) 65 - 99 mg/dL   BUN 10 6 - 20 mg/dL   Creatinine, Ser 1.08 0.61 - 1.24 mg/dL   Calcium 9.1 8.9 - 10.3 mg/dL   Total Protein 6.6 6.5 - 8.1 g/dL   Albumin 3.9 3.5 - 5.0 g/dL   AST 28 15 - 41 U/L   ALT 18 17 - 63 U/L   Alkaline Phosphatase 76 38 - 126 U/L   Total Bilirubin 0.9 0.3 - 1.2 mg/dL   GFR calc non Af Amer >60 >60 mL/min   GFR calc Af Amer >60 >60 mL/min    Comment: (NOTE) The eGFR has been calculated using the CKD EPI equation. This calculation has not been validated in all clinical situations. eGFR's persistently <60 mL/min signify possible Chronic Kidney Disease.    Anion gap 10 5 - 15  Acetaminophen level     Status: Abnormal   Collection Time: 02/03/15  2:44 PM  Result Value Ref Range   Acetaminophen (Tylenol), Serum <10 (L) 10 - 30 ug/mL    Comment:        THERAPEUTIC CONCENTRATIONS VARY SIGNIFICANTLY. A RANGE OF 10-30 ug/mL MAY BE AN EFFECTIVE CONCENTRATION FOR  MANY PATIENTS. HOWEVER, SOME ARE BEST TREATED AT CONCENTRATIONS OUTSIDE THIS RANGE. ACETAMINOPHEN CONCENTRATIONS >150 ug/mL AT 4 HOURS AFTER INGESTION AND >50 ug/mL AT 12 HOURS AFTER INGESTION ARE OFTEN ASSOCIATED WITH TOXIC REACTIONS.   Ethanol (ETOH)     Status: None   Collection Time: 02/03/15  2:44 PM  Result Value Ref Range   Alcohol, Ethyl (B) <5 <5 mg/dL    Comment:        LOWEST DETECTABLE LIMIT FOR SERUM ALCOHOL IS 5 mg/dL FOR MEDICAL  PURPOSES ONLY   Salicylate level     Status: None   Collection Time: 02/03/15  2:44 PM  Result Value Ref Range   Salicylate Lvl <9.1 2.8 - 30.0 mg/dL  Acetaminophen level     Status: Abnormal   Collection Time: 02/03/15  5:20 PM  Result Value Ref Range   Acetaminophen (Tylenol), Serum <10 (L) 10 - 30 ug/mL    Comment:        THERAPEUTIC CONCENTRATIONS VARY SIGNIFICANTLY. A RANGE OF 10-30 ug/mL MAY BE AN EFFECTIVE CONCENTRATION FOR MANY PATIENTS. HOWEVER, SOME ARE BEST TREATED AT CONCENTRATIONS OUTSIDE THIS RANGE. ACETAMINOPHEN CONCENTRATIONS >150 ug/mL AT 4 HOURS AFTER INGESTION AND >50 ug/mL AT 12 HOURS AFTER INGESTION ARE OFTEN ASSOCIATED WITH TOXIC REACTIONS.   Salicylate level     Status: None   Collection Time: 02/03/15  5:20 PM  Result Value Ref Range   Salicylate Lvl <6.6 2.8 - 30.0 mg/dL    Observation Level/Precautions:  15 minute checks  Laboratory:  .TSH, reviewed - CBC, CMP, UDS, BAL  Psychotherapy:  Individual and group therapy   Medications:  As below  Consultations:  Education officer, museum, chaplain  Discharge Concerns:  Stability ,safety       Psychological Evaluations: No   Treatment Plan Summary: Daily contact with patient to assess and evaluate symptoms and progress in treatment and Medication management  Patient will benefit from inpatient treatment and stabilization.  Estimated length of stay is 5-7 days.  Reviewed past medical records,treatment plan.   Will start a trial of Remeron 7.5 mg po qhs  for anxiety, appetite changes. Will make available Vistrail 25 mg po tid prn for breakthrough anxiety sx.  Will provide substance abuse counseling - pt tries to minimize his abuse hx.  Will add CIWA /ativan prn for CIWA>/=10.  Will continue to monitor vitals ,medication compliance and treatment side effects while patient is here.  Will monitor for medical issues as well as call consult as needed.  Reviewed labs ,will order as above.   CSW will start working on disposition.  Patient to participate in therapeutic milieu .       Medical Decision Making:  Review of Psycho-Social Stressors (1), Review or order clinical lab tests (1), Review of Last Therapy Session (1), Review of Medication Regimen & Side Effects (2) and Review of New Medication or Change in Dosage (2)  I certify that inpatient services furnished can reasonably be expected to improve the patient's condition.   Azariel Banik MD 6/21/201612:28 PM

## 2015-02-05 NOTE — Tx Team (Signed)
Initial Interdisciplinary Treatment Plan   PATIENT STRESSORS: Financial difficulties Marital or family conflict Occupational concerns Substance abuse   PATIENT STRENGTHS: Ability for insight Curator fund of knowledge Motivation for treatment/growth Religious Affiliation Supportive family/friends   PROBLEM LIST: Problem List/Patient Goals Date to be addressed Date deferred Reason deferred Estimated date of resolution  Suicide risk "I took two Valium but I wasn't trying to kill myself"  "They are lying on me" 02/05/2015     "Im depressed because The mother of my unborn child is out there using" 02/05/2015     "I need to get to my job" 02/05/2015                                          DISCHARGE CRITERIA:  Ability to meet basic life and health needs Improved stabilization in mood, thinking, and/or behavior Motivation to continue treatment in a less acute level of care Need for constant or close observation no longer present Safe-care adequate arrangements made  PRELIMINARY DISCHARGE PLAN: Attend aftercare/continuing care group Return to previous living arrangement Return to previous work or school arrangements  PATIENT/FAMIILY INVOLVEMENT: This treatment plan has been presented to and reviewed with the patient, Mark Crosby.  The patient and family have been given the opportunity to ask questions and make suggestions.  Pricilla Riffle M 02/05/2015, 4:14 AM

## 2015-02-06 MED ORDER — HYDROXYZINE HCL 25 MG PO TABS: 25.0000 mg | ORAL_TABLET | Freq: Four times a day (QID) | ORAL | Status: DC | PRN

## 2015-02-06 MED ORDER — MIRTAZAPINE 7.5 MG PO TABS: 7.5000 mg | ORAL_TABLET | Freq: Every day | ORAL | Status: DC

## 2015-02-06 NOTE — Progress Notes (Signed)
Discharge note: Pt received both written and verbal discharge instructions. Pt verbalized understanding of discharge instructions. Pt agreed to f/u appt. Pt reported that he will take sleep med if needed. Pt refused sample meds and Pharm D. made aware. Pt received belongings from room, locker and lobby. Pt transported to [facility] by security to check up on pregnant girlfriend.

## 2015-02-06 NOTE — Clinical Social Work Note (Signed)
List of couple's therapists placed on patient's chart prior to d/c.  Tilden Fossa, MSW, [place] Worker [facility] [phone]

## 2015-02-06 NOTE — Progress Notes (Signed)
  [facility] Adult Case Management Discharge Plan :  Will you be returning to the same living situation after discharge:  Yes,  Patient plans to return home with his cousin At discharge, do you have transportation home?: Yes,  patient reports access to transportation Do you have the ability to pay for your medications: Yes,  patient will be provided with medication samples and prescriptions  Release of information consent forms completed and in the chart;  Patient's signature needed at discharge.  Patient to Follow up at: Follow-up Information    Follow up with [facility].   Why:  Please contact [facility] if you are interested in individual counseling or medication management services.   Contact information:   [address]  [place], [place] [address] [phone]      Patient denies SI/HI: Yes,  denies    Safety Planning and Suicide Prevention discussed: Yes,  with patient  Have you used any form of tobacco in the last 30 days? (Cigarettes, Smokeless Tobacco, Cigars, and/or Pipes): No  Has patient been referred to the Quitline?: N/A patient is not a smoker  Courtnee Myer, Casimiro Needle 02/06/2015, 3:35 PM

## 2015-02-06 NOTE — Progress Notes (Signed)
Recreation Therapy Notes  Date: 06.22.16 Time: 9:30 am Location: [address] Dayroom  Group Topic: Stress Management  Goal Area(s) Addresses:  Patient will verbalize importance of using healthy stress management.  Patient will identify positive emotions associated with healthy stress management.   Intervention: Guided Imagery Script  Activity :  LRT will introduce and educate patient on the stress management technique of guided imagery.  A script was used to deliver the technique to patients.  Patients were asked to follow the script read aloud by the LRT to engage in practicing guided imagery.  Education:  Stress Management, Discharge Planning.   Clinical Observations/Feedback: Patient did not attend group.  Victorino Sparrow, LRT/CTRS         Victorino Sparrow A 02/06/2015 4:40 PM

## 2015-02-06 NOTE — BHH Suicide Risk Assessment (Signed)
[facility] Discharge Suicide Risk Assessment   Demographic Factors:  Male  Total Time spent with patient: 30 minutes  Musculoskeletal: Strength & Muscle Tone: within normal limits Gait & Station: normal Patient leans: N/A  Psychiatric Specialty Exam: Physical Exam  Review of Systems  Psychiatric/Behavioral: Positive for substance abuse.  All other systems reviewed and are negative.   Blood pressure 158/97, pulse 93, temperature 97.7 F (36.5 C), temperature source Oral, resp. rate 16, height 5\' 7"  (1.702 m), weight 64.411 kg (142 lb), SpO2 99 %.Body mass index is 22.24 kg/(m^2).  General Appearance: Casual  Eye Contact::  Fair  Speech:  Clear and [id number]  Volume:  Normal  Mood:  Euthymic  Affect:  Congruent  Thought Process:  Coherent  Orientation:  Full (Time, Place, and Person)  Thought Content:  WDL  Suicidal Thoughts:  No  Homicidal Thoughts:  No  Memory:  Immediate;   Fair Recent;   Fair Remote;   Fair  Judgement:  Fair  Insight:  Fair  Psychomotor Activity:  Normal  Concentration:  Fair  Recall:  [employer] of Knowledge:Fair  Language: Fair  Akathisia:  No  Handed:  Right  AIMS (if indicated):     Assets:  Communication Skills Desire for Improvement  Sleep:  Number of Hours: 6.25  Cognition: WNL  ADL's:  Intact   Have you used any form of tobacco in the last 30 days? (Cigarettes, Smokeless Tobacco, Cigars, and/or Pipes): No  Has this patient used any form of tobacco in the last 30 days? (Cigarettes, Smokeless Tobacco, Cigars, and/or Pipes) No  Mental Status Per Nursing Assessment::   On Admission:     Current Mental Status by Physician: pt denies SI/HI/AH/VH - reports it was never a suicide attempt that brought him iin to the hospital. Pt gave permission for writer to contatc the nephew - who reported pt was not a danger to self or others and the reason for bringing him in was for concerns about not eating well. Pt currently reports his appetite as better  and that he does not eat a lot on hot days .  Loss Factors: NA  Historical Factors: Impulsivity  Risk Reduction Factors:   Employed and Positive social support  Continued Clinical Symptoms:  Alcohol/Substance Abuse/Dependencies  Cognitive Features That Contribute To Risk:  Polarized thinking    Suicide Risk:  Minimal: No identifiable suicidal ideation.  Patients presenting with no risk factors but with morbid ruminations; may be classified as minimal risk based on the severity of the depressive symptoms  Principal Problem: Adjustment disorder with mixed anxiety and depressed mood Discharge Diagnoses:  Patient Active Problem List   Diagnosis Date Noted  . Cannabis use disorder, severe, dependence [F12.20] 02/05/2015  . Adjustment disorder with mixed anxiety and depressed mood [F43.23] 02/05/2015  . Alcohol use disorder, mild, abuse [F10.10] 02/05/2015  . Tobacco use disorder [Z72.0] 02/05/2015  . Hx of adenomatous colonic polyps [Z86.010] 12/12/2014  . Influenza A [J10.1] 11/07/2014  . COPD (chronic obstructive pulmonary disease) [J44.9] 11/04/2014  . Viral syndrome [B34.9] 11/04/2014  . Bilateral arm pain [M79.601, M79.602] 08/28/2014  . Neck pain of over 3 months duration [M54.2, G89.29] 08/28/2014  . History of recurrent pneumonia [Z87.01] 08/28/2014  . History of substance abuse [Z87.898] 08/28/2014    Follow-up Information    Follow up with [facility].   Why:  Please contact [facility] if you are interested in individual counseling or medication management services.   Contact information:   [address],  [place], [place] [address] [phone]      Plan Of Care/Follow-up recommendations:  Activity:  no restrictions Diet:  regular Tests:  as needed Other:  follow up with after care  Is patient on multiple antipsychotic therapies at discharge:  No   Has Patient had three or more failed trials of antipsychotic monotherapy by history:  No  Recommended Plan for  Multiple Antipsychotic Therapies: NA    Raja Liska MD 02/06/2015, 1:30 PM

## 2015-02-06 NOTE — BHH Group Notes (Signed)
[facility] LCSW Group Therapy 02/06/2015  1:15 PM   Type of Therapy: Group Therapy  Participation Level: Did Not Attend. Patient invited to participate but declined.   Tilden Fossa, MSW, [place] Worker [facility] [phone]

## 2015-02-06 NOTE — BHH Group Notes (Signed)
   [facility] LCSW Aftercare Discharge Planning Group Note  02/06/2015  8:45 AM   Participation Quality: Alert, Appropriate and Oriented  Mood/Affect: Irritable  Depression Rating: 0  Anxiety Rating: 0  Thoughts of Suicide: Pt denies SI/HI  Will you contract for safety? Yes  Current AVH: Pt denies  Plan for Discharge/Comments: Pt attended discharge planning group and actively participated in group. CSW provided pt with today's workbook. Patient plans to return home and follow up with outpatient services.  Transportation Means: Pt reports access to transportation  Supports: No supports mentioned at this time  Tilden Fossa, MSW, [place] Social Worker [employer] [phone]

## 2015-02-06 NOTE — Discharge Summary (Signed)
Physician Discharge Summary Note  Patient:  Mark Crosby is an 59 y.o., male MRN:  [id number] DOB:  04-Mar-1956 Patient phone:  [phone] (home)  Patient address:   [place] [address],  Total Time spent with patient: 45 minutes  Date of Admission:  02/05/2015 Date of Discharge: 02/06/2015  Reason for Admission:  Depression  Principal Problem: Adjustment disorder with mixed anxiety and depressed mood Discharge Diagnoses: Patient Active Problem List   Diagnosis Date Noted  . Cannabis use disorder, severe, dependence [F12.20] 02/05/2015  . Adjustment disorder with mixed anxiety and depressed mood [F43.23] 02/05/2015  . Alcohol use disorder, mild, abuse [F10.10] 02/05/2015  . Tobacco use disorder [Z72.0] 02/05/2015  . Hx of adenomatous colonic polyps [Z86.010] 12/12/2014  . Influenza A [J10.1] 11/07/2014  . COPD (chronic obstructive pulmonary disease) [J44.9] 11/04/2014  . Viral syndrome [B34.9] 11/04/2014  . Bilateral arm pain [M79.601, M79.602] 08/28/2014  . Neck pain of over 3 months duration [M54.2, G89.29] 08/28/2014  . History of recurrent pneumonia [Z87.01] 08/28/2014  . History of substance abuse [Z87.898] 08/28/2014    Musculoskeletal: Strength & Muscle Tone: within normal limits Gait & Station: normal Patient leans: N/A  Psychiatric Specialty Exam:  SEE SRA Physical Exam  Vitals reviewed. Psychiatric: His mood appears anxious.    Review of Systems  All other systems reviewed and are negative.   Blood pressure 158/97, pulse 93, temperature 97.7 F (36.5 C), temperature source Oral, resp. rate 16, height 5\' 7"  (1.702 m), weight 64.411 kg (142 lb), SpO2 99 %.Body mass index is 22.24 kg/(m^2).  Have you used any form of tobacco in the last 30 days? (Cigarettes, Smokeless Tobacco, Cigars, and/or Pipes): No  Has this patient used any form of tobacco in the last 30 days? (Cigarettes, Smokeless Tobacco, Cigars, and/or Pipes) N/A  Past Medical  History:  Past Medical History  Diagnosis Date  . Allergy   . Arthritis   . COPD (chronic obstructive pulmonary disease)   . Shortness of breath dyspnea   . Numbness of fingers     left index finger and thumb numbness  . Muscle spasms of lower extremity     spasms in lower back for couple weeks  . Hx of adenomatous colonic polyps 12/12/2014  . Chronic neck pain   . Diverticulosis     Past Surgical History  Procedure Laterality Date  . Toenail excision      ingrown toenail removed  . Colonoscopy     Family History:  Family History  Problem Relation Age of Onset  . Heart disease Mother     MI  . Stroke Sister   . Lupus Sister    Social History:  History  Alcohol Use  . 0.6 oz/week  . 1 Cans of beer, 0 Standard drinks or equivalent per week    Comment: Occasional     History  Drug Use  . Yes  . Special: Marijuana    History   Social History  . Marital Status: Single    Spouse Name: N/A  . Number of Children: N/A  . Years of Education: N/A   Occupational History  . Janitor    Social History Main Topics  . Smoking status: Former Smoker -- 82 years    Types: Pipe    Quit date: 11/04/2014  . Smokeless tobacco: Former Systems developer    Types: Chew  . Alcohol Use: 0.6 oz/week    1 Cans of beer, 0 Standard drinks or equivalent per  week     Comment: Occasional  . Drug Use: Yes    Special: Marijuana  . Sexual Activity: Yes   Other Topics Concern  . None   Social History Narrative   Married. Education [employer]   Exercise every day 1-2 hours.   Risk to Self: Is patient at risk for suicide?: Yes What has been your use of drugs/alcohol within the last 12 months?: Occasional THC use Risk to Others:   Prior Inpatient Therapy:   Prior Outpatient Therapy:    Level of Care:  OP  Hospital Course:  RIVEN MABILE was admitted for Adjustment disorder with mixed anxiety and depressed mood and crisis management.  He was treated discharged with the medications listed below  under Medication List.  Medical problems were identified and treated as needed.  Home medications were restarted as appropriate.  Improvement was monitored by observation and Mark Crosby daily report of symptom reduction.  Emotional and mental status was monitored by daily self-inventory reports completed by Mark Crosby and clinical staff.         Mark Crosby was evaluated by the treatment team for stability and plans for continued recovery upon discharge.  Mark Crosby motivation was an integral factor for scheduling further treatment.  Employment, transportation, bed availability, health status, family support, and any pending legal issues were also considered during his hospital stay.  He was offered further treatment options upon discharge including but not limited to Residential, Intensive Outpatient, and Outpatient treatment.  Mark Crosby will follow up with the services as listed below under Follow Up Information.     Upon completion of this admission the patient was both mentally and medically stable for discharge denying suicidal/homicidal ideation, auditory/visual/tactile hallucinations, delusional thoughts and paranoia.      Consults:  psychiatry  Significant Diagnostic Studies:  labs: per ED  Discharge Vitals:   Blood pressure 158/97, pulse 93, temperature 97.7 F (36.5 C), temperature source Oral, resp. rate 16, height 5\' 7"  (1.702 m), weight 64.411 kg (142 lb), SpO2 99 %. Body mass index is 22.24 kg/(m^2). Lab Results:   No results found for this or any previous visit (from the past 72 hour(s)).  Physical Findings: AIMS: Facial and Oral Movements Muscles of Facial Expression: None, normal Lips and Perioral Area: None, normal Jaw: None, normal Tongue: None, normal,Extremity Movements Upper (arms, wrists, hands, fingers): None, normal Lower (legs, knees, ankles, toes): None, normal, Trunk Movements Neck, shoulders, hips: None, normal, Overall  Severity Severity of abnormal movements (highest score from questions above): None, normal Incapacitation due to abnormal movements: None, normal, Dental Status Current problems with teeth and/or dentures?: No Does patient usually wear dentures?: No  CIWA:  CIWA-Ar Total: 0 COWS:      See Psychiatric Specialty Exam and Suicide Risk Assessment completed by Attending Physician prior to discharge.  Discharge destination:  Home  Is patient on multiple antipsychotic therapies at discharge:  No   Has Patient had three or more failed trials of antipsychotic monotherapy by history:  No    Recommended Plan for Multiple Antipsychotic Therapies: NA     Medication List    STOP taking these medications        albuterol 108 (90 BASE) MCG/ACT inhaler  Commonly known as:  PROVENTIL HFA;VENTOLIN HFA     gabapentin 100 MG capsule  Commonly known as:  NEURONTIN     guaiFENesin 100 MG/5ML liquid  Commonly known as:  ROBITUSSIN     meloxicam  7.5 MG tablet  Commonly known as:  MOBIC     VALIUM PO      TAKE these medications      Indication   hydrOXYzine 25 MG tablet  Commonly known as:  ATARAX/VISTARIL  Take 1 tablet (25 mg total) by mouth every 6 (six) hours as needed for anxiety.   Indication:  Anxiety Neurosis     mirtazapine 7.5 MG tablet  Commonly known as:  REMERON  Take 1 tablet (7.5 mg total) by mouth at bedtime.   Indication:  Trouble Sleeping           Follow-up Information    Follow up with [facility].   Why:  Please contact [facility] if you are interested in individual counseling or medication management services.   Contact information:   [address],  [place], [place] [address] Phone:([phone]      Follow-up recommendations:  Activity:  as tol, diet as tol  Comments:  1.  Take all your medications as prescribed.              2.  Report any adverse side effects to outpatient provider.                       3.  Patient instructed to not use alcohol or illegal  drugs while on prescription medicines.            4.  In the event of worsening symptoms, instructed patient to call 911, the crisis hotline or go to nearest emergency room for evaluation of symptoms.  Total Discharge Time: 40 min  Signed: Freda Munro May Kemar Pandit AGNP-BC 02/06/2015, 6:36 PM

## 2015-02-06 NOTE — Progress Notes (Signed)
D   Pt is appropriate and pleasant    He informed staff he would be discharged tomorrow and he was not suicidal or homicidal   He has been compliant with treatment and appropriate in his interactions with others A   Verbal support given   Medications administered and effectiveness monitored   Q 15 min checks R   Pt safe at present

## 2016-02-29 ENCOUNTER — Encounter (HOSPITAL_COMMUNITY): Payer: Self-pay | Admitting: Emergency Medicine

## 2016-02-29 ENCOUNTER — Emergency Department (HOSPITAL_COMMUNITY)
Admission: EM | Admit: 2016-02-29 | Discharge: 2016-02-29 | Disposition: A | Discharge status: Home / Self Care | Payer: Self-pay | Attending: Emergency Medicine | Admitting: Emergency Medicine

## 2016-02-29 DIAGNOSIS — T7840XA Allergy, unspecified, initial encounter: Secondary | ICD-10-CM | POA: Insufficient documentation

## 2016-02-29 DIAGNOSIS — M1990 Unspecified osteoarthritis, unspecified site: Secondary | ICD-10-CM | POA: Insufficient documentation

## 2016-02-29 DIAGNOSIS — J449 Chronic obstructive pulmonary disease, unspecified: Secondary | ICD-10-CM | POA: Insufficient documentation

## 2016-02-29 DIAGNOSIS — Z87891 Personal history of nicotine dependence: Secondary | ICD-10-CM | POA: Insufficient documentation

## 2016-02-29 DIAGNOSIS — Z9109 Other allergy status, other than to drugs and biological substances: Secondary | ICD-10-CM

## 2016-02-29 DIAGNOSIS — F1290 Cannabis use, unspecified, uncomplicated: Secondary | ICD-10-CM | POA: Insufficient documentation

## 2016-02-29 MED ORDER — DIPHENHYDRAMINE HCL 25 MG PO CAPS
25.0000 mg | ORAL_CAPSULE | Freq: Once | ORAL | Status: AC
  Administered 2016-02-29: 25 mg via ORAL
  Filled 2016-02-29: qty 1

## 2016-02-29 MED ORDER — DIPHENHYDRAMINE HCL 25 MG PO TABS: 25.0000 mg | ORAL_TABLET | Freq: Four times a day (QID) | ORAL | Status: DC | PRN

## 2016-02-29 NOTE — Discharge Instructions (Signed)
Please read and follow all provided instructions.  Your diagnoses today include:  1. House dust mite allergy    Tests performed today include:  Vital signs. See below for your results today.   Medications prescribed:   Take as prescribed   Home care instructions:  Follow any educational materials contained in this packet.  Follow-up instructions: Please follow-up with your primary care provider for further evaluation of symptoms and treatment   Return instructions:   Please return to the Emergency Department if you do not get better, if you get worse, or new symptoms OR  - Fever (temperature greater than 101.65F)  - Bleeding that does not stop with holding pressure to the area    -Severe pain (please note that you may be more sore the day after your accident)  - Chest Pain  - Difficulty breathing  - Severe nausea or vomiting  - Inability to tolerate food and liquids  - Passing out  - Skin becoming red around your wounds  - Change in mental status (confusion or lethargy)  - New numbness or weakness     Please return if you have any other emergent concerns.  Additional Information:  Your vital signs today were: BP 110/78 mmHg   Pulse 113   Temp(Src) 98.7 F (37.1 C) (Oral)   Resp 20   Ht 5\' 8"  (1.727 m)   Wt 74.844 kg   BMI 25.09 kg/m2   SpO2 96% If your blood pressure (BP) was elevated above 135/85 this visit, please have this repeated by your doctor within one month. ---------------  Physical barriers -- Physical barriers used to control domestic allergen exposure include covers for pillows, mattresses, box springs, comforters, and furniture cushions The simplest types of covers are plastic, which may be uncomfortable for some patients. Considerable effort has gone into identifying alternative fabrics, including coated plastics, permeable synthetics that allow vapor and air movement, nonwoven synthetics that allow airflow without passage of particles >1 micron in diameter,  and finely woven fabrics with pore sizes as small as 2 microns Woven fabrics with a designated pore size up to approximately 6 microns are preferable because these are very effective at controlling the passage of dust mite as well as cat allergens, while still permitting adequate airflow. These fabrics will also completely block the passage of immature and adult live dust mites. Woven fabrics are identifiable by their smooth texture, higher relative cost, and ability to be laundered repeatedly. In contrast, nonwoven materials, which look and feel similar to heavy weight paper toweling, can retain high levels of allergen on the surface and lose integrity with repeated washing   Efforts should be made to restrict the presence of carpets, upholstered furniture, and drapes in the environment of the dust mite-allergic person, in order to reduce the sites that can be colonized by dust mites. Dusting of surfaces and vacuuming of floors using a vacuum equipped with a high-efficiency particular air (HEPA) filter or with double thickness bags should be performed regularly. The number of stuffed toys in children's bedrooms should be minimized. Carpeting can be removed and replaced with finished floors and washable area rugs.  The use of chemicals to kill dust mites or denature allergens has also been investigated, but the data in favor of this approach are modest. Several chemicals have been tested, but only benzyl benzoate and tannic acid have been marketed in the [place]. Prolonged eradication of dust mites is not possible with commercially available chemicals. ?Benzyl benzoate is highly toxic  to dust mites in the laboratory setting, but when applied to carpets, produces only a modest decrease in allergens (<60 percent) that is short-lived [43]. ?Tannic acid potently denatures proteins in vitro, but has a minimal effect when applied to carpets that harbor dust mites and other allergens

## 2016-02-29 NOTE — ED Notes (Signed)
Pt from home with his significant other. Pt states he has dust mites on his skin. This RN does not see anything on his arms. Pt states he does not currently see any mites. Pt states his itching is worse between his legs and when he takes a shower. Pt states he put "blue star ointment" on his skin and then he saw many mites. Pt's significant other denies symptoms and denies seeing mites on her spouse. Pt denies SI, HI, AVH

## 2016-02-29 NOTE — ED Provider Notes (Signed)
CSN: [id number]     Arrival date & time 02/29/16  1957 History  By signing my name below, I, Mark Crosby, attest that this documentation has been prepared under the direction and in the presence of non-physician practitioner, Shary Decamp, PA-C. Electronically Signed: Dora Crosby, Scribe. 02/29/2016. 8:42 PM.   Chief Complaint  Patient presents with  . skin irritation     The history is provided by the patient. No language interpreter was used.    HPI Comments: Mark Crosby is a 60 y.o. male who presents to the Emergency Department complaining of sudden onset, constant, severe, generalized skin irritation from dust mites for the last 5 days. Pt endorses a burning, stinging, itching sensation all over his body. He reports that he saw the dust mites "light up" for the first time last night; he states that he bathed in blue star ointment and saw the dust mites all over his body. He also notes a burning and stinging sensation in his eyes. He denies bite marks, fever, or any other associated symptoms. Denies SI, HI.   Past Medical History  Diagnosis Date  . Allergy   . Arthritis   . COPD (chronic obstructive pulmonary disease) ([place])   . Shortness of breath dyspnea   . Numbness of fingers     left index finger and thumb numbness  . Muscle spasms of lower extremity     spasms in lower back for couple weeks  . Hx of adenomatous colonic polyps 12/12/2014  . Chronic neck pain   . Diverticulosis    Past Surgical History  Procedure Laterality Date  . Toenail excision      ingrown toenail removed  . Colonoscopy     Family History  Problem Relation Age of Onset  . Heart disease Mother     MI  . Stroke Sister   . Lupus Sister    Social History  Substance Use Topics  . Smoking status: Former Smoker -- 53 years    Types: Pipe    Quit date: 11/04/2014  . Smokeless tobacco: Former Systems developer    Types: Chew  . Alcohol Use: 0.6 oz/week    1 Cans of beer, 0 Standard drinks or equivalent per  week     Comment: Occasional    Review of Systems  Constitutional: Negative for fever.  Skin: Positive for rash (itching from dust mites). Negative for wound (bite marks).  All other systems reviewed and are negative.  Allergies  Percocet  Home Medications   Prior to Admission medications   Medication Sig Start Date End Date Taking? Authorizing Provider  hydrOXYzine (ATARAX/VISTARIL) 25 MG tablet Take 1 tablet (25 mg total) by mouth every 6 (six) hours as needed for anxiety. 02/06/15   Kerrie Buffalo, NP  mirtazapine (REMERON) 7.5 MG tablet Take 1 tablet (7.5 mg total) by mouth at bedtime. 02/06/15   Kerrie Buffalo, NP   BP 110/78 mmHg  Pulse 113  Temp(Src) 98.7 F (37.1 C) (Oral)  Resp 20  Ht 5\' 8"  (1.727 m)  Wt 165 lb (74.844 kg)  BMI 25.09 kg/m2  SpO2 96%   Physical Exam  Constitutional: He is oriented to person, place, and time. He appears well-developed and well-nourished. No distress.  HENT:  Head: Normocephalic and atraumatic.  Eyes: Conjunctivae and EOM are normal.  Neck: Neck supple. No tracheal deviation present.  Cardiovascular: Normal rate and regular rhythm.   Pulmonary/Chest: Effort normal. No respiratory distress.  Abdominal: Soft.  Musculoskeletal: Normal range of motion.  Neurological: He is alert and oriented to person, place, and time.  Skin: Skin is warm and dry. No rash noted.  No visible bite marks or wounds noted on body. No excoriations. No abrasions. No indication for scabies.   Psychiatric: He has a normal mood and affect. His behavior is normal. Thought content normal.  Nursing note and vitals reviewed.  ED Course  Procedures (including critical care time)  DIAGNOSTIC STUDIES: Oxygen Saturation is 96% on RA, adequate by my interpretation.    COORDINATION OF CARE: 8:42 PM Discussed treatment plan with pt at bedside and pt agreed to plan.  Labs Review Labs Reviewed - No data to display  Imaging Review No results found. I have  personally reviewed and evaluated these images and lab results as part of my medical decision-making.   EKG Interpretation None      MDM  I have reviewed the relevant previous healthcare records. I obtained HPI from historian.  ED Course:  Assessment: Pt is a 60yM who presents with possible dust mite allergic reaction at home. On exam, pt in NAD. Nontoxic/nonseptic appearing. VSS. Afebrile. No visible excoriations, abrasions, bite marks, rashes noted on exam. Given benadryl in ED. Plan is to DC home with follow up to PCP. Give instructions for home care of dust mite. At time of discharge, Patient is in no acute distress. Vital Signs are stable. Patient is able to ambulate. Patient able to tolerate PO.    Disposition/Plan:  DC Home Additional Verbal discharge instructions given and discussed with patient.  Pt Instructed to f/u with PCP in the next week for evaluation and treatment of symptoms. Return precautions given Pt acknowledges and agrees with plan  Supervising Physician Quintella Reichert, MD   Final diagnoses:  House dust mite allergy   I personally performed the services described in this documentation, which was scribed in my presence. The recorded information has been reviewed and is accurate.   Shary Decamp, PA-C 02/29/16 2057  Quintella Reichert, MD 03/01/16 [phone]

## 2017-03-17 ENCOUNTER — Ambulatory Visit (HOSPITAL_COMMUNITY)
Admission: EM | Admit: 2017-03-17 | Discharge: 2017-03-17 | Disposition: A | Discharge status: Home / Self Care | Payer: Self-pay | Attending: Family Medicine | Admitting: Family Medicine

## 2017-03-17 ENCOUNTER — Encounter (HOSPITAL_COMMUNITY): Payer: Self-pay | Admitting: Emergency Medicine

## 2017-03-17 DIAGNOSIS — Z113 Encounter for screening for infections with a predominantly sexual mode of transmission: Secondary | ICD-10-CM

## 2017-03-17 DIAGNOSIS — R030 Elevated blood-pressure reading, without diagnosis of hypertension: Secondary | ICD-10-CM

## 2017-03-17 DIAGNOSIS — Z885 Allergy status to narcotic agent status: Secondary | ICD-10-CM | POA: Insufficient documentation

## 2017-03-17 DIAGNOSIS — Z202 Contact with and (suspected) exposure to infections with a predominantly sexual mode of transmission: Secondary | ICD-10-CM

## 2017-03-17 LAB — POCT URINALYSIS DIP (DEVICE)
BILIRUBIN URINE: NEGATIVE
Glucose, UA: NEGATIVE mg/dL
Hgb urine dipstick: NEGATIVE
KETONES UR: NEGATIVE mg/dL
Leukocytes, UA: NEGATIVE
Nitrite: NEGATIVE
PROTEIN: NEGATIVE mg/dL
Urobilinogen, UA: 0.2 mg/dL (ref 0.0–1.0)
pH: 6 (ref 5.0–8.0)

## 2017-03-17 MED ORDER — AZITHROMYCIN 250 MG PO TABS
1000.0000 mg | ORAL_TABLET | Freq: Once | ORAL | Status: AC
  Administered 2017-03-17: 1000 mg via ORAL

## 2017-03-17 MED ORDER — CEFTRIAXONE SODIUM 250 MG IJ SOLR
INTRAMUSCULAR | Status: AC
  Filled 2017-03-17: qty 250

## 2017-03-17 MED ORDER — LIDOCAINE HCL (PF) 1 % IJ SOLN
INTRAMUSCULAR | Status: AC
  Filled 2017-03-17: qty 2

## 2017-03-17 MED ORDER — CEFTRIAXONE SODIUM 250 MG IJ SOLR
250.0000 mg | Freq: Once | INTRAMUSCULAR | Status: AC
  Administered 2017-03-17: 250 mg via INTRAMUSCULAR

## 2017-03-17 MED ORDER — AZITHROMYCIN 250 MG PO TABS
ORAL_TABLET | ORAL | Status: AC
  Filled 2017-03-17: qty 4

## 2017-03-17 NOTE — ED Notes (Signed)
Dirty and clean urine collected. 

## 2017-03-17 NOTE — ED Triage Notes (Addendum)
The patient presented to the [facility] with a complaint of abdominal pain and groin pain and urinary frequency after sexual intercourse with a new partner 1 week ago.

## 2017-03-18 NOTE — ED Provider Notes (Signed)
  [place]   [id number] 03/17/17 Arrival Time: [place]:  Today you were diagnosed with the following: 1. Possible exposure to STD     Meds ordered this encounter  Medications  . azithromycin (ZITHROMAX) tablet 1,000 mg  . cefTRIAXone (ROCEPHIN) injection 250 mg   Empiric treatment given. Urine cytology sent. Will call pt with results.  Reviewed expectations re: course of current medical issues. Questions answered. Outlined signs and symptoms indicating need for more acute intervention. Patient verbalized understanding. After Visit Summary given.   SUBJECTIVE:  Mark Crosby is a 61 y.o. male who presents with complaint of possible exposure to STD. New male sexual partner last week. "Some tingling" of penis at urethral meatus. Does not recall discharge. No rashes. Occasional abdominal cramping but unsure if related. No fever. Otherwise well.   ROS: As per HPI.   OBJECTIVE:  Vitals:   03/17/17 1936  BP: (!) 148/92  Pulse: 80  Resp: 16  Temp: 98.3 F (36.8 C)  TempSrc: Oral  SpO2: 95%     General appearance: alert, cooperative, appears stated age and no distress Throat: lips, mucosa, and tongue normal; teeth and gums normal Back: no CVA tenderness Abdomen: soft, non-tender; bowel sounds normal; no masses or organomegaly; no guarding or rebound tenderness GU: declines Skin: warm and dry Psychological:  Alert and cooperative. Normal mood and affect.  Results for orders placed or performed during the hospital encounter of 03/17/17  POCT urinalysis dip (device)  Result Value Ref Range   Glucose, UA NEGATIVE NEGATIVE mg/dL   Bilirubin Urine NEGATIVE NEGATIVE   Ketones, ur NEGATIVE NEGATIVE mg/dL   Specific Gravity, Urine <=1.005 1.005 - 1.030   Hgb urine dipstick NEGATIVE NEGATIVE   pH 6.0 5.0 - 8.0   Protein, ur NEGATIVE NEGATIVE mg/dL   Urobilinogen, UA 0.2 0.0 - 1.0 mg/dL   Nitrite NEGATIVE NEGATIVE   Leukocytes, UA NEGATIVE  NEGATIVE    Labs Reviewed  POCT URINALYSIS DIP (DEVICE)  URINE CYTOLOGY ANCILLARY ONLY     Allergies  Allergen Reactions  . Percocet [Oxycodone-Acetaminophen] Itching    Itching with large dose of medication    PMHx, SurgHx, SocialHx, Medications, and Allergies were reviewed in the Visit Navigator and updated as appropriate.       Vanessa Kick, MD 03/18/17 1021

## 2017-03-19 LAB — URINE CYTOLOGY ANCILLARY ONLY
CHLAMYDIA, DNA PROBE: NEGATIVE
Neisseria Gonorrhea: NEGATIVE

## 2017-10-02 ENCOUNTER — Emergency Department (HOSPITAL_COMMUNITY): Payer: No Typology Code available for payment source

## 2017-10-02 ENCOUNTER — Emergency Department (HOSPITAL_COMMUNITY): Payer: No Typology Code available for payment source | Admitting: Anesthesiology

## 2017-10-02 ENCOUNTER — Inpatient Hospital Stay (HOSPITAL_COMMUNITY)
Admission: EM | Admit: 2017-10-02 | Discharge: 2017-10-05 | DRG: 482 | Disposition: A | Discharge status: Home / Self Care | Payer: No Typology Code available for payment source | Attending: Orthopedic Surgery | Admitting: Orthopedic Surgery

## 2017-10-02 ENCOUNTER — Other Ambulatory Visit: Payer: Self-pay

## 2017-10-02 ENCOUNTER — Encounter (HOSPITAL_COMMUNITY): Payer: Self-pay | Admitting: Emergency Medicine

## 2017-10-02 ENCOUNTER — Encounter (HOSPITAL_COMMUNITY)
Admission: EM | Disposition: A | Discharge status: Home / Self Care | Payer: Self-pay | Source: Home / Self Care | Attending: Orthopedic Surgery

## 2017-10-02 DIAGNOSIS — K08409 Partial loss of teeth, unspecified cause, unspecified class: Secondary | ICD-10-CM | POA: Diagnosis present

## 2017-10-02 DIAGNOSIS — S72401B Unspecified fracture of lower end of right femur, initial encounter for open fracture type I or II: Secondary | ICD-10-CM

## 2017-10-02 DIAGNOSIS — Z590 Homelessness: Secondary | ICD-10-CM

## 2017-10-02 DIAGNOSIS — Y9289 Other specified places as the place of occurrence of the external cause: Secondary | ICD-10-CM | POA: Diagnosis not present

## 2017-10-02 DIAGNOSIS — F17210 Nicotine dependence, cigarettes, uncomplicated: Secondary | ICD-10-CM | POA: Diagnosis present

## 2017-10-02 DIAGNOSIS — S72301B Unspecified fracture of shaft of right femur, initial encounter for open fracture type I or II: Principal | ICD-10-CM | POA: Diagnosis present

## 2017-10-02 DIAGNOSIS — F17290 Nicotine dependence, other tobacco product, uncomplicated: Secondary | ICD-10-CM | POA: Diagnosis present

## 2017-10-02 DIAGNOSIS — Z87828 Personal history of other (healed) physical injury and trauma: Secondary | ICD-10-CM

## 2017-10-02 DIAGNOSIS — Z419 Encounter for procedure for purposes other than remedying health state, unspecified: Secondary | ICD-10-CM

## 2017-10-02 DIAGNOSIS — S7291XB Unspecified fracture of right femur, initial encounter for open fracture type I or II: Secondary | ICD-10-CM | POA: Diagnosis present

## 2017-10-02 DIAGNOSIS — Y9389 Activity, other specified: Secondary | ICD-10-CM | POA: Diagnosis not present

## 2017-10-02 DIAGNOSIS — M79604 Pain in right leg: Secondary | ICD-10-CM | POA: Diagnosis present

## 2017-10-02 HISTORY — DX: Accidental discharge from unspecified firearms or gun, initial encounter: W34.00XA

## 2017-10-02 HISTORY — DX: Personal history of other diseases of the respiratory system: Z87.09

## 2017-10-02 HISTORY — PX: FEMUR IM NAIL: SHX1597

## 2017-10-02 LAB — COMPREHENSIVE METABOLIC PANEL
ALBUMIN: 3.8 g/dL (ref 3.5–5.0)
ALT: 14 U/L — ABNORMAL LOW (ref 17–63)
AST: 33 U/L (ref 15–41)
Alkaline Phosphatase: 74 U/L (ref 38–126)
Anion gap: 12 (ref 5–15)
BILIRUBIN TOTAL: 0.8 mg/dL (ref 0.3–1.2)
BUN: 10 mg/dL (ref 6–20)
CHLORIDE: 105 mmol/L (ref 101–111)
CO2: 21 mmol/L — ABNORMAL LOW (ref 22–32)
Calcium: 8.8 mg/dL — ABNORMAL LOW (ref 8.9–10.3)
Creatinine, Ser: 1.1 mg/dL (ref 0.61–1.24)
GFR calc Af Amer: >60 mL/min (ref >60)
GLUCOSE: 137 mg/dL — AB (ref 65–99)
POTASSIUM: 3.5 mmol/L (ref 3.5–5.1)
Sodium: 138 mmol/L (ref 135–145)
TOTAL PROTEIN: 6.8 g/dL (ref 6.5–8.1)

## 2017-10-02 LAB — I-STAT CHEM 8, ED
BUN: 11 mg/dL (ref 6–20)
CALCIUM ION: 1.1 mmol/L — AB (ref 1.15–1.40)
Chloride: 103 mmol/L (ref 101–111)
Creatinine, Ser: 1 mg/dL (ref 0.61–1.24)
Glucose, Bld: 137 mg/dL — ABNORMAL HIGH (ref 65–99)
HEMATOCRIT: 46 % (ref 39.0–52.0)
Hemoglobin: 15.6 g/dL (ref 13.0–17.0)
Potassium: 3.4 mmol/L — ABNORMAL LOW (ref 3.5–5.1)
SODIUM: 140 mmol/L (ref 135–145)
TCO2: 23 mmol/L (ref 22–32)

## 2017-10-02 LAB — CBC
HCT: 36.8 % — ABNORMAL LOW (ref 39.0–52.0)
HEMATOCRIT: 45 % (ref 39.0–52.0)
HEMOGLOBIN: 15.1 g/dL (ref 13.0–17.0)
Hemoglobin: 12.4 g/dL — ABNORMAL LOW (ref 13.0–17.0)
MCH: 32.5 pg (ref 26.0–34.0)
MCH: 32.7 pg (ref 26.0–34.0)
MCHC: 33.6 g/dL (ref 30.0–36.0)
MCHC: 33.7 g/dL (ref 30.0–36.0)
MCV: 97 fL (ref 78.0–100.0)
MCV: 97.1 fL (ref 78.0–100.0)
PLATELETS: 269 10*3/uL (ref 150–400)
Platelets: 291 10*3/uL (ref 150–400)
RBC: 4.64 MIL/uL (ref 4.22–5.81)
RBC: 3.79 MIL/uL — AB (ref 4.22–5.81)
RDW: 12.5 % (ref 11.5–15.5)
RDW: 12.4 % (ref 11.5–15.5)
WBC: 10.7 10*3/uL — AB (ref 4.0–10.5)
WBC: 10.2 10*3/uL (ref 4.0–10.5)

## 2017-10-02 LAB — CREATININE, SERUM
CREATININE: 1.22 mg/dL (ref 0.61–1.24)
GFR calc non Af Amer: >60 mL/min (ref >60)

## 2017-10-02 LAB — I-STAT CG4 LACTIC ACID, ED: LACTIC ACID, VENOUS: 1.9 mmol/L (ref 0.5–1.9)

## 2017-10-02 LAB — SAMPLE TO BLOOD BANK

## 2017-10-02 LAB — ETHANOL: Alcohol, Ethyl (B): <10 mg/dL (ref <10)

## 2017-10-02 LAB — PROTIME-INR
INR: 1.08
PROTHROMBIN TIME: 14 s (ref 11.4–15.2)

## 2017-10-02 LAB — CDS SEROLOGY

## 2017-10-02 SURGERY — INSERTION, INTRAMEDULLARY ROD, FEMUR, RETROGRADE
Anesthesia: General | Site: Leg Upper | Laterality: Right

## 2017-10-02 MED ORDER — HYDROMORPHONE HCL 1 MG/ML IJ SOLN
INTRAMUSCULAR | Status: AC
  Administered 2017-10-02: 0.5 mg via INTRAVENOUS
  Filled 2017-10-02: qty 1

## 2017-10-02 MED ORDER — CEFAZOLIN SODIUM-DEXTROSE 2-3 GM-%(50ML) IV SOLR
INTRAVENOUS | Status: DC | PRN
  Administered 2017-10-02: 2 g via INTRAVENOUS

## 2017-10-02 MED ORDER — MIDAZOLAM HCL 2 MG/2ML IJ SOLN
0.5000 mg | Freq: Once | INTRAMUSCULAR | Status: AC | PRN
  Administered 2017-10-02: 1 mg via INTRAVENOUS

## 2017-10-02 MED ORDER — ONDANSETRON HCL 4 MG/2ML IJ SOLN: 4.0000 mg | Freq: Four times a day (QID) | INTRAMUSCULAR | Status: DC | PRN

## 2017-10-02 MED ORDER — DOCUSATE SODIUM 100 MG PO CAPS
100.0000 mg | ORAL_CAPSULE | Freq: Two times a day (BID) | ORAL | Status: DC
Start: 2017-10-02 — End: 2017-10-05
  Administered 2017-10-02: 100 mg via ORAL
  Filled 2017-10-02 (×6): qty 1

## 2017-10-02 MED ORDER — HYDROMORPHONE HCL 1 MG/ML IJ SOLN
1.0000 mg | Freq: Once | INTRAMUSCULAR | Status: AC
  Administered 2017-10-02: 1 mg via INTRAVENOUS
  Filled 2017-10-02: qty 1

## 2017-10-02 MED ORDER — DEXAMETHASONE SODIUM PHOSPHATE 10 MG/ML IJ SOLN
INTRAMUSCULAR | Status: AC
  Filled 2017-10-02: qty 1

## 2017-10-02 MED ORDER — DEXAMETHASONE SODIUM PHOSPHATE 10 MG/ML IJ SOLN
INTRAMUSCULAR | Status: DC | PRN
  Administered 2017-10-02: 10 mg via INTRAVENOUS

## 2017-10-02 MED ORDER — POLYETHYLENE GLYCOL 3350 17 G PO PACK: 17.0000 g | PACK | Freq: Every day | ORAL | Status: DC | PRN

## 2017-10-02 MED ORDER — ONDANSETRON HCL 4 MG/2ML IJ SOLN
INTRAMUSCULAR | Status: AC
  Filled 2017-10-02: qty 2

## 2017-10-02 MED ORDER — PROPOFOL 10 MG/ML IV BOLUS
INTRAVENOUS | Status: DC | PRN
  Administered 2017-10-02: 200 mg via INTRAVENOUS

## 2017-10-02 MED ORDER — MAGNESIUM CITRATE PO SOLN: 1.0000 | Freq: Once | ORAL | Status: DC | PRN

## 2017-10-02 MED ORDER — LIDOCAINE 2% (20 MG/ML) 5 ML SYRINGE
INTRAMUSCULAR | Status: AC
Start: 2017-10-02 — End: ?
  Filled 2017-10-02: qty 5

## 2017-10-02 MED ORDER — CEFAZOLIN SODIUM-DEXTROSE 2-4 GM/100ML-% IV SOLN
INTRAVENOUS | Status: AC
  Filled 2017-10-02: qty 100

## 2017-10-02 MED ORDER — METHOCARBAMOL 1000 MG/10ML IJ SOLN
500.0000 mg | Freq: Four times a day (QID) | INTRAMUSCULAR | Status: DC | PRN
  Filled 2017-10-02: qty 5

## 2017-10-02 MED ORDER — ENOXAPARIN SODIUM 40 MG/0.4ML ~~LOC~~ SOLN
40.0000 mg | SUBCUTANEOUS | Status: DC
  Administered 2017-10-03 – 2017-10-05 (×3): 40 mg via SUBCUTANEOUS
  Filled 2017-10-02 (×3): qty 0.4

## 2017-10-02 MED ORDER — FENTANYL CITRATE (PF) 100 MCG/2ML IJ SOLN
INTRAMUSCULAR | Status: DC | PRN
  Administered 2017-10-02 (×2): 100 ug via INTRAVENOUS
  Administered 2017-10-02 (×2): 50 ug via INTRAVENOUS

## 2017-10-02 MED ORDER — SODIUM CHLORIDE 0.9 % IR SOLN
Status: DC | PRN
  Administered 2017-10-02: 3000 mL

## 2017-10-02 MED ORDER — HYDROMORPHONE HCL 1 MG/ML IJ SOLN
0.2500 mg | INTRAMUSCULAR | Status: DC | PRN
  Administered 2017-10-02 (×3): 0.5 mg via INTRAVENOUS

## 2017-10-02 MED ORDER — FENTANYL CITRATE (PF) 250 MCG/5ML IJ SOLN
INTRAMUSCULAR | Status: AC
  Filled 2017-10-02: qty 5

## 2017-10-02 MED ORDER — MIDAZOLAM HCL 5 MG/5ML IJ SOLN
INTRAMUSCULAR | Status: DC | PRN
  Administered 2017-10-02: 2 mg via INTRAVENOUS

## 2017-10-02 MED ORDER — SENNA 8.6 MG PO TABS
1.0000 | ORAL_TABLET | Freq: Two times a day (BID) | ORAL | Status: DC
  Administered 2017-10-02: 8.6 mg via ORAL
  Filled 2017-10-02 (×6): qty 1

## 2017-10-02 MED ORDER — CELECOXIB 200 MG PO CAPS
200.0000 mg | ORAL_CAPSULE | Freq: Two times a day (BID) | ORAL | Status: DC
  Administered 2017-10-02 – 2017-10-05 (×7): 200 mg via ORAL
  Filled 2017-10-02 (×7): qty 1

## 2017-10-02 MED ORDER — IOPAMIDOL (ISOVUE-300) INJECTION 61%
INTRAVENOUS | Status: AC
Start: 2017-10-02 — End: 2017-10-02
  Administered 2017-10-02: 100 mL
  Filled 2017-10-02: qty 100

## 2017-10-02 MED ORDER — ROCURONIUM BROMIDE 10 MG/ML (PF) SYRINGE
PREFILLED_SYRINGE | INTRAVENOUS | Status: AC
Start: 2017-10-02 — End: ?
  Filled 2017-10-02: qty 5

## 2017-10-02 MED ORDER — HYDROMORPHONE HCL 1 MG/ML IJ SOLN
INTRAMUSCULAR | Status: AC
  Filled 2017-10-02: qty 1

## 2017-10-02 MED ORDER — CEFAZOLIN SODIUM-DEXTROSE 2-4 GM/100ML-% IV SOLN
2.0000 g | Freq: Once | INTRAVENOUS | Status: AC
  Administered 2017-10-02: 2 g via INTRAVENOUS

## 2017-10-02 MED ORDER — OXYCODONE HCL 5 MG PO TABS
10.0000 mg | ORAL_TABLET | ORAL | Status: DC | PRN
  Administered 2017-10-02 – 2017-10-04 (×5): 10 mg via ORAL
  Filled 2017-10-02 (×5): qty 2

## 2017-10-02 MED ORDER — ONDANSETRON HCL 4 MG/2ML IJ SOLN
4.0000 mg | Freq: Once | INTRAMUSCULAR | Status: AC
Start: 2017-10-02 — End: 2017-10-02
  Administered 2017-10-02: 4 mg via INTRAVENOUS
  Filled 2017-10-02: qty 2

## 2017-10-02 MED ORDER — MIDAZOLAM HCL 2 MG/2ML IJ SOLN
INTRAMUSCULAR | Status: AC
Start: 2017-10-02 — End: ?
  Filled 2017-10-02: qty 2

## 2017-10-02 MED ORDER — METHOCARBAMOL 500 MG PO TABS
500.0000 mg | ORAL_TABLET | Freq: Four times a day (QID) | ORAL | Status: DC | PRN
  Administered 2017-10-02 – 2017-10-04 (×2): 500 mg via ORAL
  Filled 2017-10-02: qty 1

## 2017-10-02 MED ORDER — LACTATED RINGERS IV SOLN
INTRAVENOUS | Status: DC
  Administered 2017-10-02 (×2): via INTRAVENOUS

## 2017-10-02 MED ORDER — ACETAMINOPHEN 650 MG RE SUPP: 650.0000 mg | RECTAL | Status: DC | PRN

## 2017-10-02 MED ORDER — ONDANSETRON HCL 4 MG/2ML IJ SOLN
INTRAMUSCULAR | Status: DC | PRN
  Administered 2017-10-02: 4 mg via INTRAVENOUS

## 2017-10-02 MED ORDER — BISACODYL 10 MG RE SUPP: 10.0000 mg | Freq: Every day | RECTAL | Status: DC | PRN

## 2017-10-02 MED ORDER — PROPOFOL 10 MG/ML IV BOLUS
INTRAVENOUS | Status: AC
  Filled 2017-10-02: qty 20

## 2017-10-02 MED ORDER — METOCLOPRAMIDE HCL 5 MG/ML IJ SOLN: 5.0000 mg | Freq: Three times a day (TID) | INTRAMUSCULAR | Status: DC | PRN

## 2017-10-02 MED ORDER — PHENYLEPHRINE HCL 10 MG/ML IJ SOLN
INTRAMUSCULAR | Status: DC | PRN
  Administered 2017-10-02: 200 ug via INTRAVENOUS

## 2017-10-02 MED ORDER — PROMETHAZINE HCL 25 MG/ML IJ SOLN: 6.2500 mg | INTRAMUSCULAR | Status: DC | PRN

## 2017-10-02 MED ORDER — LACTATED RINGERS IV SOLN
INTRAVENOUS | Status: DC
  Administered 2017-10-03 (×2): via INTRAVENOUS

## 2017-10-02 MED ORDER — METHOCARBAMOL 500 MG PO TABS
ORAL_TABLET | ORAL | Status: AC
  Administered 2017-10-02: 500 mg via ORAL
  Filled 2017-10-02: qty 1

## 2017-10-02 MED ORDER — SUCCINYLCHOLINE CHLORIDE 200 MG/10ML IV SOSY
PREFILLED_SYRINGE | INTRAVENOUS | Status: AC
  Filled 2017-10-02: qty 10

## 2017-10-02 MED ORDER — LIDOCAINE HCL (CARDIAC) 20 MG/ML IV SOLN
INTRAVENOUS | Status: DC | PRN
  Administered 2017-10-02: 100 mg via INTRAVENOUS

## 2017-10-02 MED ORDER — 0.9 % SODIUM CHLORIDE (POUR BTL) OPTIME
TOPICAL | Status: DC | PRN
  Administered 2017-10-02: 1000 mL

## 2017-10-02 MED ORDER — ROCURONIUM BROMIDE 100 MG/10ML IV SOLN
INTRAVENOUS | Status: DC | PRN
  Administered 2017-10-02: 50 mg via INTRAVENOUS

## 2017-10-02 MED ORDER — MIDAZOLAM HCL 2 MG/2ML IJ SOLN
INTRAMUSCULAR | Status: AC
  Administered 2017-10-02: 1 mg via INTRAVENOUS
  Filled 2017-10-02: qty 2

## 2017-10-02 MED ORDER — ACETAMINOPHEN 325 MG PO TABS: 650.0000 mg | ORAL_TABLET | ORAL | Status: DC | PRN

## 2017-10-02 MED ORDER — ONDANSETRON HCL 4 MG PO TABS: 4.0000 mg | ORAL_TABLET | Freq: Four times a day (QID) | ORAL | Status: DC | PRN

## 2017-10-02 MED ORDER — MEPERIDINE HCL 50 MG/ML IJ SOLN: 6.2500 mg | INTRAMUSCULAR | Status: DC | PRN

## 2017-10-02 MED ORDER — OXYCODONE HCL 5 MG PO TABS
5.0000 mg | ORAL_TABLET | ORAL | Status: DC | PRN
  Administered 2017-10-03 – 2017-10-04 (×2): 5 mg via ORAL
  Filled 2017-10-02 (×2): qty 1

## 2017-10-02 MED ORDER — CEFAZOLIN SODIUM-DEXTROSE 2-4 GM/100ML-% IV SOLN
2.0000 g | Freq: Four times a day (QID) | INTRAVENOUS | Status: AC
  Administered 2017-10-02 – 2017-10-03 (×3): 2 g via INTRAVENOUS
  Filled 2017-10-02 (×3): qty 100

## 2017-10-02 MED ORDER — MORPHINE SULFATE (PF) 4 MG/ML IV SOLN: 2.0000 mg | INTRAVENOUS | Status: DC | PRN

## 2017-10-02 MED ORDER — SUGAMMADEX SODIUM 200 MG/2ML IV SOLN
INTRAVENOUS | Status: AC
  Filled 2017-10-02: qty 2

## 2017-10-02 MED ORDER — SUGAMMADEX SODIUM 200 MG/2ML IV SOLN
INTRAVENOUS | Status: DC | PRN
  Administered 2017-10-02: 140 mg via INTRAVENOUS

## 2017-10-02 MED ORDER — METOCLOPRAMIDE HCL 5 MG PO TABS: 5.0000 mg | ORAL_TABLET | Freq: Three times a day (TID) | ORAL | Status: DC | PRN

## 2017-10-02 SURGICAL SUPPLY — 49 items
BANDAGE ACE 6X5 VEL STRL LF (GAUZE/BANDAGES/DRESSINGS) ×2 IMPLANT
BIT DRILL CALIBRATED 4.3X320MM (BIT) IMPLANT
BIT DRILL CROWE PNT TWST 4.5MM (DRILL) IMPLANT
CHLORAPREP W/TINT 26ML (MISCELLANEOUS) ×4 IMPLANT
COVER SURGICAL LIGHT HANDLE (MISCELLANEOUS) ×4 IMPLANT
DRAPE C-ARM 42X72 X-RAY (DRAPES) ×3 IMPLANT
DRAPE C-ARMOR (DRAPES) ×2 IMPLANT
DRAPE IMP U-DRAPE 54X76 (DRAPES) ×3 IMPLANT
DRAPE ORTHO SPLIT 77X108 STRL (DRAPES) ×6
DRAPE SURG ORHT 6 SPLT 77X108 (DRAPES) ×2 IMPLANT
DRAPE U-SHAPE 47X51 STRL (DRAPES) ×3 IMPLANT
DRILL CALIBRATED 4.3X320MM (BIT) ×3
DRILL CROWE POINT TWIST 4.5MM (DRILL) ×3
DRSG MEPITEL 4X7.2 (GAUZE/BANDAGES/DRESSINGS) ×4 IMPLANT
ELECT REM PT RETURN 9FT ADLT (ELECTROSURGICAL) ×3
ELECTRODE REM PT RTRN 9FT ADLT (ELECTROSURGICAL) ×1 IMPLANT
GAUZE SPONGE 4X4 12PLY STRL (GAUZE/BANDAGES/DRESSINGS) ×3 IMPLANT
GLOVE BIO SURGEON STRL SZ8 (GLOVE) ×6 IMPLANT
GLOVE BIOGEL PI IND STRL 8 (GLOVE) ×1 IMPLANT
GLOVE BIOGEL PI INDICATOR 8 (GLOVE) ×2
GOWN STRL REUS W/ TWL LRG LVL3 (GOWN DISPOSABLE) ×3 IMPLANT
GOWN STRL REUS W/TWL LRG LVL3 (GOWN DISPOSABLE) ×6
GUIDEPIN 3.2X17.5 THRD DISP (PIN) ×4 IMPLANT
GUIDEWIRE BEAD TIP (WIRE) ×2 IMPLANT
IMMOBILIZER KNEE 22 UNIV (SOFTGOODS) ×2 IMPLANT
KIT BASIN OR (CUSTOM PROCEDURE TRAY) ×3 IMPLANT
KIT ROOM TURNOVER OR (KITS) ×3 IMPLANT
NAIL FEM RETRO 10.5X360 (Nail) ×2 IMPLANT
PACK ORTHO EXTREMITY (CUSTOM PROCEDURE TRAY) ×3 IMPLANT
PACK UNIVERSAL I (CUSTOM PROCEDURE TRAY) ×3 IMPLANT
PAD ABD 8X10 STRL (GAUZE/BANDAGES/DRESSINGS) ×4 IMPLANT
PAD ARMBOARD 7.5X6 YLW CONV (MISCELLANEOUS) ×6 IMPLANT
PADDING CAST COTTON 6X4 STRL (CAST SUPPLIES) ×2 IMPLANT
SCREW CORT TI DBL LEAD 5X30 (Screw) ×2 IMPLANT
SCREW CORT TI DBL LEAD 5X50 (Screw) ×2 IMPLANT
SCREW CORT TI DBL LEAD 5X60 (Screw) ×2 IMPLANT
SCREW CORT TI DBL LEAD 5X90 (Screw) ×2 IMPLANT
SET CYSTO W/LG BORE CLAMP LF (SET/KITS/TRAYS/PACK) ×2 IMPLANT
SPONGE LAP 18X18 X RAY DECT (DISPOSABLE) ×3 IMPLANT
STAPLER VISISTAT 35W (STAPLE) ×7 IMPLANT
SUT ETHILON 3 0 PS 1 (SUTURE) ×2 IMPLANT
SUT VIC AB 2-0 CT1 27 (SUTURE) ×3
SUT VIC AB 2-0 CT1 TAPERPNT 27 (SUTURE) IMPLANT
TOWEL OR 17X24 6PK STRL BLUE (TOWEL DISPOSABLE) ×3 IMPLANT
TOWEL OR 17X26 10 PK STRL BLUE (TOWEL DISPOSABLE) ×3 IMPLANT
TUBE CONNECTING 12'X1/4 (SUCTIONS) ×1
TUBE CONNECTING 12X1/4 (SUCTIONS) ×2 IMPLANT
UNDERPAD 30X30 (UNDERPADS AND DIAPERS) ×5 IMPLANT
YANKAUER SUCT BULB TIP NO VENT (SUCTIONS) ×3 IMPLANT

## 2017-10-02 NOTE — Progress Notes (Signed)
Orthopedic Tech Progress Note Patient Details:  Mark Crosby Jun 18, 1956 [id number]  Musculoskeletal Traction Type of Traction: Wynonia Hazard Traction Traction Location: rle   Post Interventions Patient Tolerated: Well Instructions Provided: Care of device Applied traction as per drs verbal order.  [name] 10/02/2017, 4:05 AM
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
## 2017-10-02 NOTE — Progress Notes (Deleted)
Orthopedic Tech Progress Note Patient Details:  Mark Crosby 02/10/1956 [id number]      Post Interventions Patient Tolerated: Well Instructions Provided: Care of device   [name] 10/02/2017, 4:04 AM

## 2017-10-02 NOTE — Anesthesia Procedure Notes (Signed)
Procedure Name: Intubation Date/Time: 10/02/2017 8:44 AM Performed by: Lance Coon, CRNA Pre-anesthesia Checklist: Patient identified, Emergency Drugs available, Suction available, Patient being monitored and Timeout performed Patient Re-evaluated:Patient Re-evaluated prior to induction Oxygen Delivery Method: Circle system utilized Preoxygenation: Pre-oxygenation with 100% oxygen Induction Type: IV induction Ventilation: Mask ventilation without difficulty Laryngoscope Size: Miller and 3 Grade View: Grade I Tube type: Oral Tube size: 7.5 mm Number of attempts: 1 Airway Equipment and Method: Stylet Placement Confirmation: ETT inserted through vocal cords under direct vision,  positive ETCO2 and breath sounds checked- equal and bilateral Secured at: 22 cm Tube secured with: Tape Dental Injury: Teeth and Oropharynx as per pre-operative assessment
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
## 2017-10-02 NOTE — Plan of Care (Signed)
  Progressing Education: Knowledge of General Education information will improve 10/02/2017 2051 - Progressing by Mikey College, RN Clinical Measurements: Ability to maintain clinical measurements within normal limits will improve 10/02/2017 2051 - Progressing by Mikey College, RN Will remain free from infection 10/02/2017 2051 - Progressing by Mikey College, RN Respiratory complications will improve 10/02/2017 2051 - Progressing by Mikey College, RN Pain Managment: General experience of comfort will improve 10/02/2017 2051 - Progressing by Mikey College, RN

## 2017-10-02 NOTE — Anesthesia Postprocedure Evaluation (Signed)
Anesthesia Post Note  Patient: Mark Crosby  Procedure(s) Performed: INTRAMEDULLARY (IM) RETROGRADE FEMORAL NAILING (Right Leg Upper)     Patient location during evaluation: PACU Anesthesia Type: General Level of consciousness: awake and alert, patient cooperative and oriented Pain management: pain level controlled Vital Signs Assessment: post-procedure vital signs reviewed and stable Respiratory status: spontaneous breathing, nonlabored ventilation and respiratory function stable Cardiovascular status: blood pressure returned to baseline and stable Postop Assessment: no apparent nausea or vomiting Anesthetic complications: no    Last Vitals:  Vitals:   10/02/17 1239 10/02/17 1305  BP: (!) 172/99 (!) 151/84  Pulse: 88 90  Resp: (!) 28 (!) 22  Temp:  37 C  SpO2: 98% 90%    Last Pain:  Vitals:   10/02/17 1305  TempSrc: Oral  PainSc:                  Keon Benscoter,E. Dennie Vecchio
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
## 2017-10-02 NOTE — Transfer of Care (Signed)
Immediate Anesthesia Transfer of Care Note  Patient: Mark Crosby  Procedure(s) Performed: INTRAMEDULLARY (IM) RETROGRADE FEMORAL NAILING (Right Leg Upper)  Patient Location: PACU  Anesthesia Type:General  Level of Consciousness: awake and patient cooperative  Airway & Oxygen Therapy: Patient Spontanous Breathing and Patient connected to face mask oxygen  Post-op Assessment: Report given to RN and Post -op Vital signs reviewed and stable  Post vital signs: Reviewed and stable  Last Vitals:  Vitals:   10/02/17 0700 10/02/17 0715  BP: 138/89 (!) 152/91  Pulse: 98 93  Resp: (!) 9 11  Temp:    SpO2: 91% 100%    Last Pain:  Vitals:   10/02/17 0652  TempSrc:   PainSc: 6          Complications: No apparent anesthesia complications
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
## 2017-10-02 NOTE — Discharge Instructions (Addendum)
Wylene Simmer, MD [place]  Please read the following information regarding your care after surgery.  Medications  You only need a prescription for the narcotic pain medicine (ex. oxycodone, Percocet, Norco).  All of the other medicines listed below are available over the counter. X Aleve 2 pills twice a day for the first 3 days after surgery. X acetominophen (Tylenol) 650 mg every 4-6 hours as you need for minor to moderate pain X oxycodone as prescribed for severe pain  Narcotic pain medicine (ex. oxycodone, Percocet, Vicodin) will cause constipation.  To prevent this problem, take the following medicines while you are taking any pain medicine. X docusate sodium (Colace) 100 mg twice a day X senna (Senokot) 2 tablets twice a day  X To help prevent blood clots, take lovenox injection once a day for two weeks after surgery.  You should also get up every hour while you are awake to move around.    Weight Bearing X Do not bear any weight on the operated leg or foot.  Cast / Splint / Dressing X Keep your splint, cast or dressing clean and dry.  Dont put anything (coat hanger, pencil, etc) down inside of it.  If it gets damp, use a hair dryer on the cool setting to dry it.  If it gets soaked, call the office to schedule an appointment for a cast change.     After your dressing, cast or splint is removed; you may shower, but do not soak or scrub the wound.  Allow the water to run over it, and then gently pat it dry.  Swelling It is normal for you to have swelling where you had surgery.  To reduce swelling and pain, keep your toes above your nose for at least 3 days after surgery.  It may be necessary to keep your foot or leg elevated for several weeks.  If it hurts, it should be elevated.  Follow Up Call my office at [phone] when you are discharged from the hospital or surgery center to schedule an appointment to be seen two weeks after surgery.  Call my office at  [phone] if you develop a fever >101.5 F, nausea, vomiting, bleeding from the surgical site or severe pain.

## 2017-10-02 NOTE — ED Notes (Signed)
Patient transported to CT 

## 2017-10-03 HISTORY — PX: FEMUR IM NAIL: SHX1597
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
## 2017-10-03 NOTE — Progress Notes (Signed)
Subjective: 1 Day Post-Op Procedure(s) (LRB): INTRAMEDULLARY (IM) RETROGRADE FEMORAL NAILING (Right) Patient reports pain as mild.  Reports pain at the knee seems well controlled right now, did have some more intense pain overnight last night. No CP, SOB, fever, chills, N/V. No other c/o.  Objective: Vital signs in last 24 hours: Temp:  [97.3 F (36.3 C)-98.9 F (37.2 C)] 98.8 F (37.1 C) (02/17 0503) Pulse Rate:  [81-122] 87 (02/17 0503) Resp:  [17-34] 18 (02/17 0503) BP: (142-183)/(71-105) 142/71 (02/17 0503) SpO2:  [90 %-100 %] 94 % (02/17 0503)  Intake/Output from previous day: 02/16 0701 - 02/17 0700 In: 2750 [P.O.:250; I.V.:2500] Out: 2200 [Urine:1900; Stool:250; Blood:50] Intake/Output this shift: No intake/output data recorded.  Recent Labs    10/02/17 0323 10/02/17 0331 10/02/17 2116  HGB 15.1 15.6 12.4*   Recent Labs    10/02/17 0323 10/02/17 0331 10/02/17 2116  WBC 10.7*  --  10.2  RBC 4.64  --  3.79*  HCT 45.0 46.0 36.8*  PLT 291  --  269   Recent Labs    10/02/17 0323 10/02/17 0331 10/02/17 2116  NA 138 140  --   K 3.5 3.4*  --   CL 105 103  --   CO2 21*  --   --   BUN 10 11  --   CREATININE 1.10 1.00 1.22  GLUCOSE 137* 137*  --   CALCIUM 8.8*  --   --    Recent Labs    10/02/17 0323  INR 1.08    Neurologically intact ABD soft Neurovascular intact Sensation intact distally Intact pulses distally Dorsiflexion/Plantar flexion intact Incision: dressing C/D/I and no drainage No cellulitis present Compartment soft no calf pain or sign of DVT  Assessment/Plan: 1 Day Post-Op Procedure(s) (LRB): INTRAMEDULLARY (IM) RETROGRADE FEMORAL NAILING (Right) Advance diet Up with therapy D/C IV fluids  NWB RLE Lovenox for DVT ppx  Louden Houseworth M. 10/03/2017, 7:47 AM

## 2017-10-04 ENCOUNTER — Encounter (HOSPITAL_COMMUNITY): Payer: Self-pay | Admitting: General Practice

## 2017-10-04 MED ORDER — ENOXAPARIN SODIUM 40 MG/0.4ML ~~LOC~~ SOLN: 40.0000 mg | SUBCUTANEOUS | 0 refills | Status: DC

## 2017-10-04 MED ORDER — DOCUSATE SODIUM 100 MG PO CAPS: 100.0000 mg | ORAL_CAPSULE | Freq: Two times a day (BID) | ORAL | 0 refills | Status: AC

## 2017-10-04 MED ORDER — SENNA 8.6 MG PO TABS: 2.0000 | ORAL_TABLET | Freq: Two times a day (BID) | ORAL | 0 refills | Status: AC

## 2017-10-04 MED ORDER — OXYCODONE HCL 5 MG PO TABS: 5.0000 mg | ORAL_TABLET | ORAL | 0 refills | Status: DC | PRN
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
## 2017-10-04 NOTE — Social Work (Addendum)
CSW spoke with pt again, pt still states he would not like shelter list, but will accept transportation voucher. States that he is waiting on a "friend to call him back to let him know where his car is."   CSW continuing to follow. MD office paged, message left requesting return call to speak about disposition.   5:15pm- CSW was contacted by RN Case Manager as pt would like a list of shelters, CSW provided list and spoke with pt about friend who he is sending the list to. Encouraged him to reach out to the friend and ask if he could stay with them for at least a little bit after discharge. Explained that the beds at shelters are not guaranteed, but CSW would support pt with transportation to wherever he felt that he could go in the morning. Pt aware that ortho feels he is medically appropriate to discharge tomorrow.   CSW spoke with Larkin Ina at ortho office and they are aware, and are discharging pt tomorrow.   Alexander Mt, [place] Work [phone]
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
## 2017-10-04 NOTE — Care Management Note (Signed)
Case Management Note  Patient Details  Name: Mark Crosby MRN: [id number] Date of Birth: 1955/09/30  Subjective/Objective:                    Action/Plan:  Provided [place] letter and [facility] and Wellness information. Patient voiced understanding. Patient requesting shelter list " i'll take a picture of it and forward it to friends to see where they think I should go". Patient states he cannot stay with family/ friends . Bedside nurse Joaquim Lai will get crutches for patient .  Expected Discharge Date:                  Expected Discharge Plan:  Home/Self Care  In-House Referral:  Clinical Social Work, Scientist, research (medical)  CM Consult, [facility], [place], Medication Assistance  Post Acute Care Choice:  Durable Medical Equipment, Home Health Choice offered to:  Patient  DME Arranged:    DME Agency:     HH Arranged:    [place] Agency:     Status of Service:  In process, will continue to follow  If discussed at Long Length of Stay Meetings, dates discussed:    Additional Comments:  Marilu Favre, RN 10/04/2017, 4:32 PM

## 2017-10-05 MED ORDER — ASPIRIN EC 81 MG PO TBEC: 81.0000 mg | DELAYED_RELEASE_TABLET | Freq: Two times a day (BID) | ORAL | 0 refills | Status: DC
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
## 2017-10-05 NOTE — Progress Notes (Signed)
Spoke w Mechele Claude PA about concerns for hygenic lovenox injections for this patient who is [place] to homeless shelter. MD consulted and plan will be to change to ASA 81mg  BID. Laurice Record PA will update AVS when out of surgery. CM updated bedside RN.

## 2017-10-05 NOTE — Progress Notes (Signed)
Orthopedic Tech Progress Note Patient Details:  Mark Crosby 1956/07/14 [id number]  Ortho Devices Type of Ortho Device: Crutches   Post Interventions Patient Tolerated: Well, Ambulated well Instructions Provided: Adjustment of device, Care of device, Poper ambulation with device   [name] 10/05/2017, 9:53 AM

## 2017-10-05 NOTE — Progress Notes (Signed)
Discharge home. Home discharge instruction given, no question verbalized. Family transporting patient .

## 2017-10-07 ENCOUNTER — Encounter (HOSPITAL_COMMUNITY): Payer: Self-pay | Admitting: Emergency Medicine

## 2017-11-15 ENCOUNTER — Encounter: Payer: Self-pay | Admitting: Internal Medicine

## 2019-01-15 IMAGING — RF DG C-ARM 61-120 MIN
1 series · 6 of 6 positions shown · non-contrast
Comparison: Right femur x-rays from same day.

CLINICAL DATA: Right femur intramedullary nail placement.

EXAM:
RIGHT FEMUR 2 VIEWS; DG C-ARM 61-120 MIN

[Series 1: run · 6 of 6 slices shown]
[im 1/6]
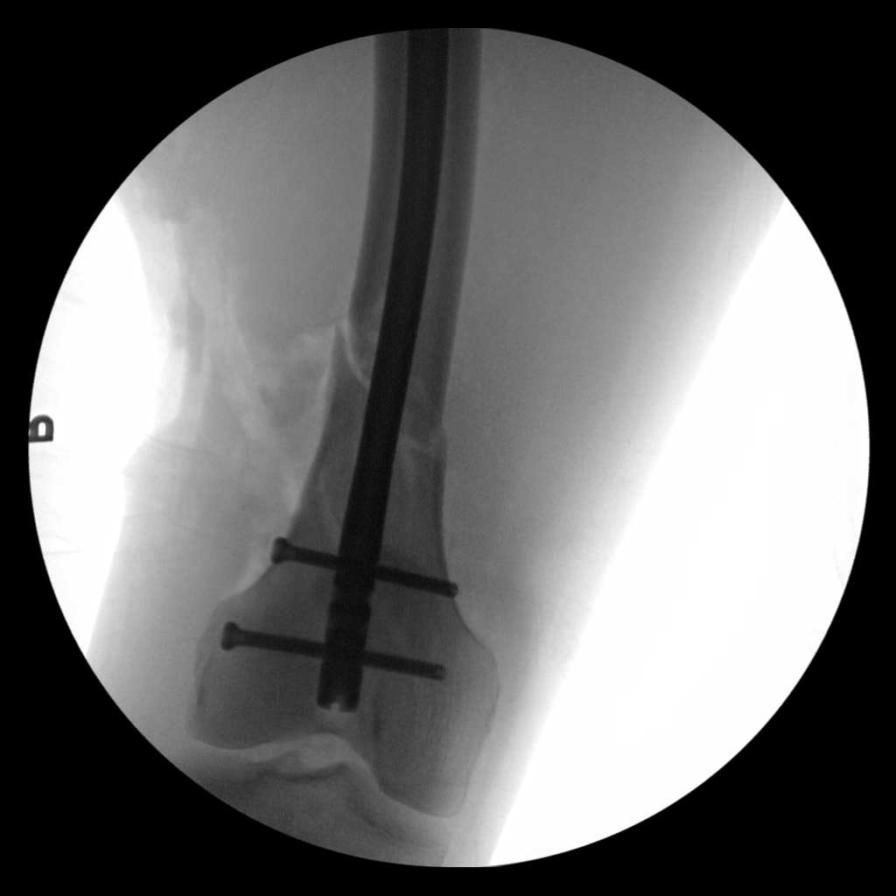
[im 2/6]
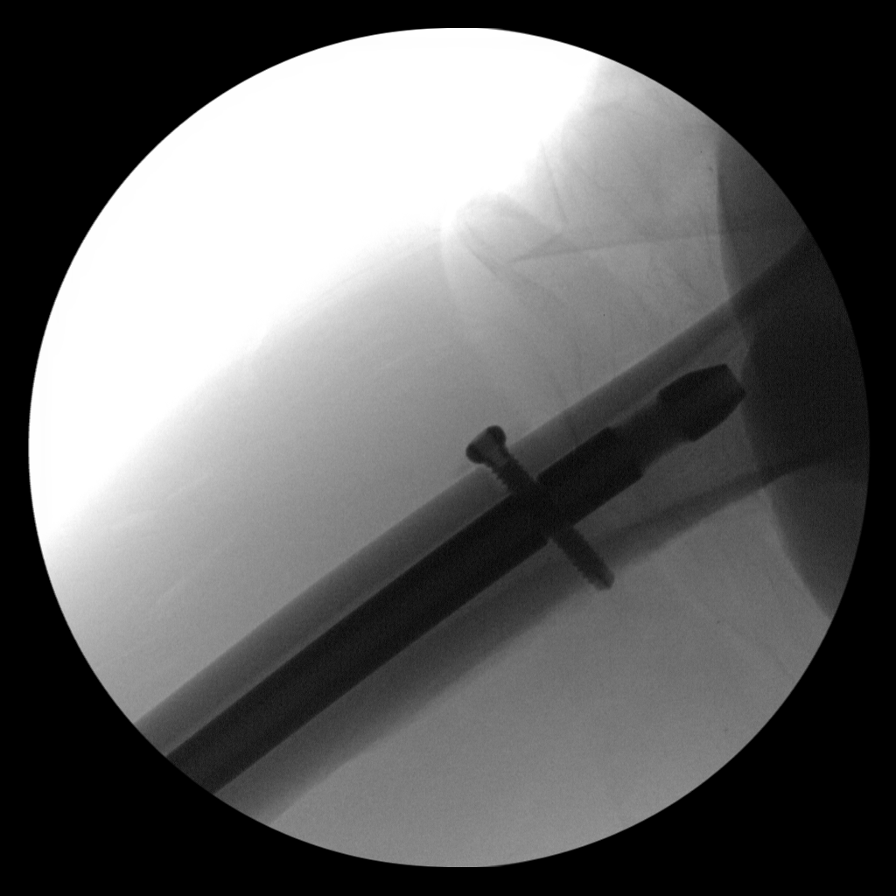
[im 3/6]
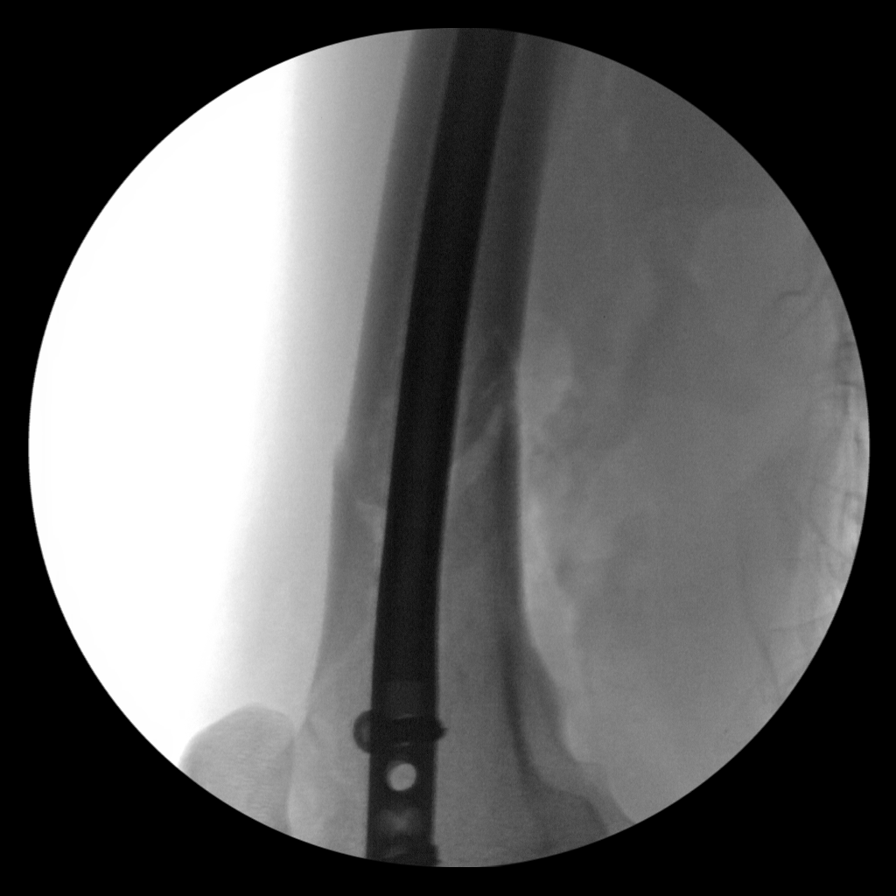
[im 4/6]
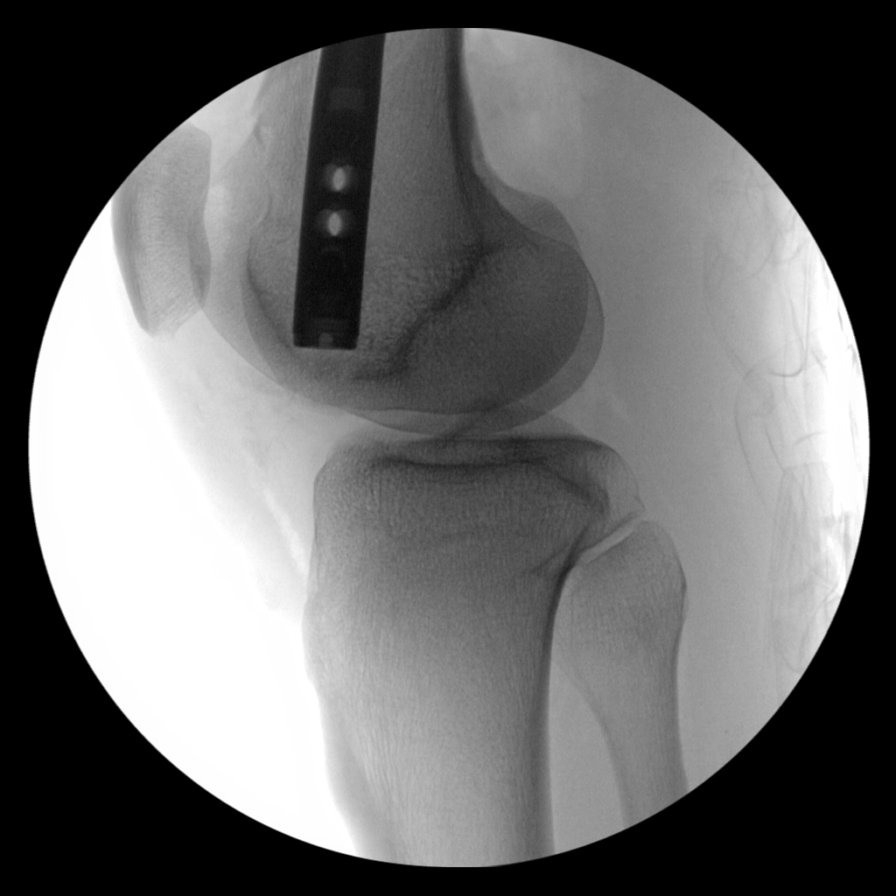
[im 5/6]
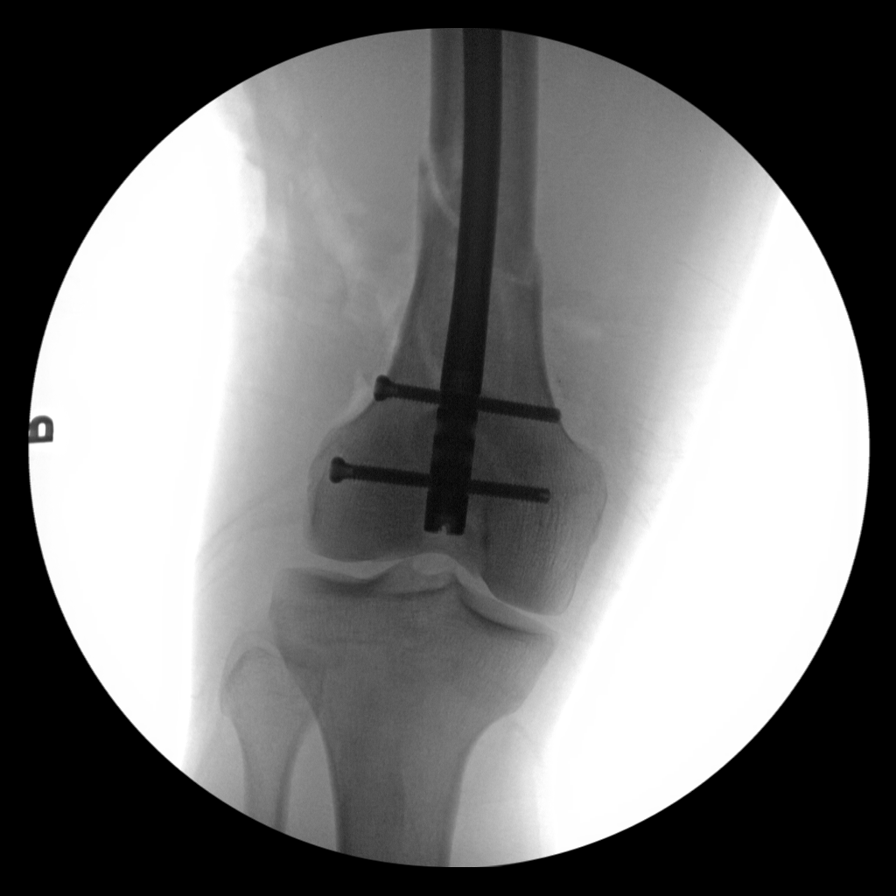
[im 6/6]
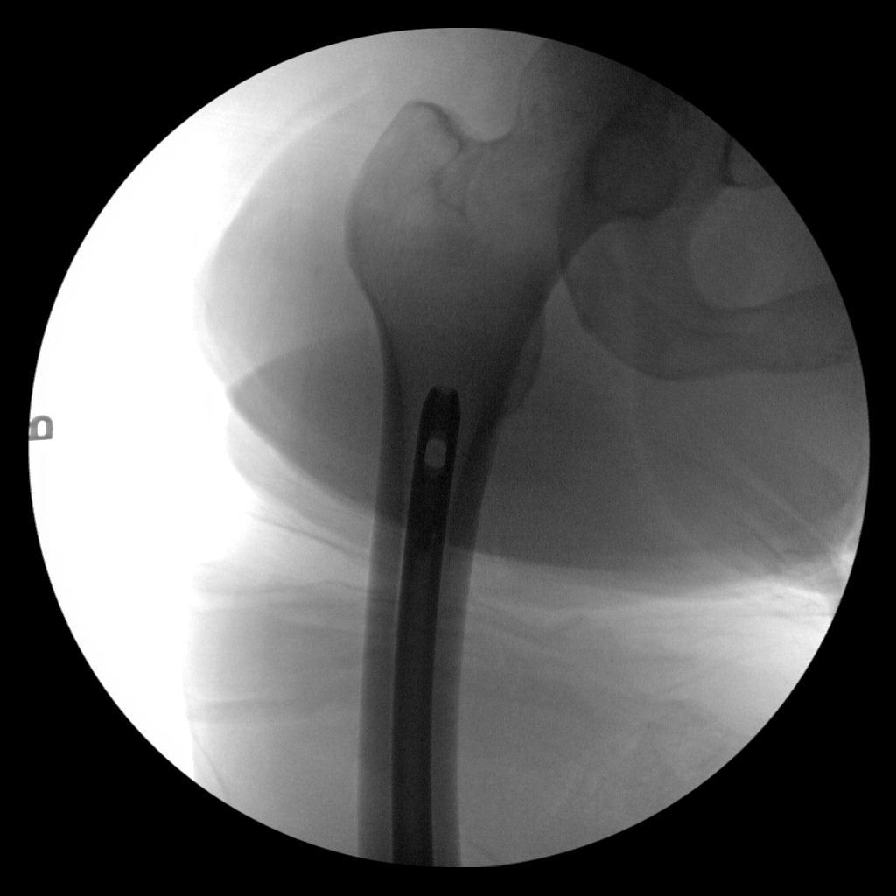

[6 of 6 positions shown; findings below may reference images not displayed]

FINDINGS: Multiple intraoperative x-rays demonstrate interval placement of an
intramedullary nail within the right femur with proximal and distal
interlocking screws. Alignment is anatomic.
IMPRESSION: Interval intramedullary nail fixation of a distal right femur
fracture, now in anatomic alignment.

FLUOROSCOPY TIME:  127 seconds.

C-arm fluoroscopic images were obtained intraoperatively and
submitted for post operative interpretation.
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
## 2020-01-27 ENCOUNTER — Emergency Department (HOSPITAL_COMMUNITY): Payer: Medicaid Other

## 2020-01-27 ENCOUNTER — Other Ambulatory Visit: Payer: Self-pay

## 2020-01-27 ENCOUNTER — Encounter (HOSPITAL_COMMUNITY): Payer: Self-pay

## 2020-01-27 ENCOUNTER — Inpatient Hospital Stay (HOSPITAL_COMMUNITY)
Admission: AD | Admit: 2020-01-27 | Discharge: 2020-01-29 | DRG: 158 | Disposition: A | Discharge status: Home / Self Care | Payer: Medicaid Other | Attending: Internal Medicine | Admitting: Internal Medicine

## 2020-01-27 DIAGNOSIS — F17200 Nicotine dependence, unspecified, uncomplicated: Secondary | ICD-10-CM

## 2020-01-27 DIAGNOSIS — I10 Essential (primary) hypertension: Secondary | ICD-10-CM | POA: Diagnosis present

## 2020-01-27 DIAGNOSIS — Z860101 Personal history of adenomatous and serrated colon polyps: Secondary | ICD-10-CM

## 2020-01-27 DIAGNOSIS — K122 Cellulitis and abscess of mouth: Principal | ICD-10-CM

## 2020-01-27 DIAGNOSIS — F1220 Cannabis dependence, uncomplicated: Secondary | ICD-10-CM | POA: Diagnosis present

## 2020-01-27 DIAGNOSIS — F4323 Adjustment disorder with mixed anxiety and depressed mood: Secondary | ICD-10-CM

## 2020-01-27 DIAGNOSIS — Z20822 Contact with and (suspected) exposure to covid-19: Secondary | ICD-10-CM | POA: Diagnosis present

## 2020-01-27 DIAGNOSIS — J449 Chronic obstructive pulmonary disease, unspecified: Secondary | ICD-10-CM | POA: Diagnosis present

## 2020-01-27 DIAGNOSIS — F1911 Other psychoactive substance abuse, in remission: Secondary | ICD-10-CM | POA: Diagnosis not present

## 2020-01-27 DIAGNOSIS — Z86010 Personal history of colonic polyps: Secondary | ICD-10-CM

## 2020-01-27 DIAGNOSIS — F1010 Alcohol abuse, uncomplicated: Secondary | ICD-10-CM | POA: Diagnosis present

## 2020-01-27 DIAGNOSIS — W3400XA Accidental discharge from unspecified firearms or gun, initial encounter: Secondary | ICD-10-CM

## 2020-01-27 DIAGNOSIS — L0211 Cutaneous abscess of neck: Secondary | ICD-10-CM | POA: Diagnosis present

## 2020-01-27 DIAGNOSIS — J431 Panlobular emphysema: Secondary | ICD-10-CM

## 2020-01-27 DIAGNOSIS — F17210 Nicotine dependence, cigarettes, uncomplicated: Secondary | ICD-10-CM | POA: Diagnosis present

## 2020-01-27 LAB — BASIC METABOLIC PANEL
Anion gap: 11 (ref 5–15)
BUN: 13 mg/dL (ref 8–23)
CO2: 24 mmol/L (ref 22–32)
Calcium: 8.7 mg/dL — ABNORMAL LOW (ref 8.9–10.3)
Chloride: 107 mmol/L (ref 98–111)
Creatinine, Ser: 1.08 mg/dL (ref 0.61–1.24)
GFR calc Af Amer: >60 mL/min (ref >60)
GFR calc non Af Amer: >60 mL/min (ref >60)
Glucose, Bld: 94 mg/dL (ref 70–99)
Potassium: 3.8 mmol/L (ref 3.5–5.1)
Sodium: 142 mmol/L (ref 135–145)

## 2020-01-27 LAB — CBC WITH DIFFERENTIAL/PLATELET
Abs Immature Granulocytes: 0.18 10*3/uL — ABNORMAL HIGH (ref 0.00–0.07)
Basophils Absolute: 0.1 10*3/uL (ref 0.0–0.1)
Basophils Relative: 0 %
Eosinophils Absolute: 0 10*3/uL (ref 0.0–0.5)
Eosinophils Relative: 0 %
HCT: 44.7 % (ref 39.0–52.0)
Hemoglobin: 14.1 g/dL (ref 13.0–17.0)
Immature Granulocytes: 1 %
Lymphocytes Relative: 8 %
Lymphs Abs: 1.6 10*3/uL (ref 0.7–4.0)
MCH: 32.2 pg (ref 26.0–34.0)
MCHC: 31.5 g/dL (ref 30.0–36.0)
MCV: 102.1 fL — ABNORMAL HIGH (ref 80.0–100.0)
Monocytes Absolute: 2 10*3/uL — ABNORMAL HIGH (ref 0.1–1.0)
Monocytes Relative: 10 %
Neutro Abs: 17 10*3/uL — ABNORMAL HIGH (ref 1.7–7.7)
Neutrophils Relative %: 81 %
Platelets: 325 10*3/uL (ref 150–400)
RBC: 4.38 MIL/uL (ref 4.22–5.81)
RDW: 12.5 % (ref 11.5–15.5)
WBC: 20.8 10*3/uL — ABNORMAL HIGH (ref 4.0–10.5)
nRBC: 0 % (ref 0.0–0.2)

## 2020-01-27 LAB — LACTIC ACID, PLASMA
Lactic Acid, Venous: 1.7 mmol/L (ref 0.5–1.9)
Lactic Acid, Venous: 1.5 mmol/L (ref 0.5–1.9)

## 2020-01-27 LAB — SARS CORONAVIRUS 2 BY RT PCR (HOSPITAL ORDER, PERFORMED IN ~~LOC~~ HOSPITAL LAB): SARS Coronavirus 2: NEGATIVE

## 2020-01-27 MED ORDER — SODIUM CHLORIDE 0.9 % IV BOLUS
1000.0000 mL | Freq: Once | INTRAVENOUS | Status: AC
  Administered 2020-01-27: 1000 mL via INTRAVENOUS

## 2020-01-27 MED ORDER — METHYLPREDNISOLONE SODIUM SUCC 125 MG IJ SOLR
60.0000 mg | Freq: Every day | INTRAMUSCULAR | Status: DC
  Administered 2020-01-28: 60 mg via INTRAVENOUS
  Filled 2020-01-27: qty 2

## 2020-01-27 MED ORDER — ACETAMINOPHEN 650 MG RE SUPP: 650.0000 mg | Freq: Four times a day (QID) | RECTAL | Status: DC | PRN

## 2020-01-27 MED ORDER — SODIUM CHLORIDE (PF) 0.9 % IJ SOLN
INTRAMUSCULAR | Status: AC
  Filled 2020-01-27: qty 50

## 2020-01-27 MED ORDER — HEPARIN SODIUM (PORCINE) 5000 UNIT/ML IJ SOLN
5000.0000 [IU] | Freq: Three times a day (TID) | INTRAMUSCULAR | Status: DC
  Administered 2020-01-28 (×2): 5000 [IU] via SUBCUTANEOUS
  Filled 2020-01-27 (×3): qty 1

## 2020-01-27 MED ORDER — SODIUM CHLORIDE 0.9 % IV SOLN
INTRAVENOUS | Status: DC | PRN
  Administered 2020-01-27 – 2020-01-28 (×3): 250 mL via INTRAVENOUS

## 2020-01-27 MED ORDER — LEVALBUTEROL HCL 1.25 MG/0.5ML IN NEBU
1.2500 mg | INHALATION_SOLUTION | Freq: Four times a day (QID) | RESPIRATORY_TRACT | Status: DC
  Administered 2020-01-27: 1.25 mg via RESPIRATORY_TRACT
  Filled 2020-01-27: qty 0.5

## 2020-01-27 MED ORDER — PIPERACILLIN-TAZOBACTAM 3.375 G IVPB
3.3750 g | Freq: Three times a day (TID) | INTRAVENOUS | Status: DC
  Administered 2020-01-27 – 2020-01-29 (×6): 3.375 g via INTRAVENOUS
  Filled 2020-01-27 (×6): qty 50

## 2020-01-27 MED ORDER — LORAZEPAM 2 MG/ML IJ SOLN
0.0000 mg | INTRAMUSCULAR | Status: DC
  Filled 2020-01-27: qty 1

## 2020-01-27 MED ORDER — CLINDAMYCIN PHOSPHATE 600 MG/50ML IV SOLN
600.0000 mg | Freq: Once | INTRAVENOUS | Status: AC
  Administered 2020-01-27: 600 mg via INTRAVENOUS
  Filled 2020-01-27: qty 50

## 2020-01-27 MED ORDER — NICOTINE 21 MG/24HR TD PT24
21.0000 mg | MEDICATED_PATCH | Freq: Every day | TRANSDERMAL | Status: DC
  Administered 2020-01-28: 21 mg via TRANSDERMAL
  Filled 2020-01-27 (×2): qty 1

## 2020-01-27 MED ORDER — LORAZEPAM 2 MG/ML IJ SOLN: 1.0000 mg | INTRAMUSCULAR | Status: DC | PRN

## 2020-01-27 MED ORDER — VANCOMYCIN HCL IN DEXTROSE 1-5 GM/200ML-% IV SOLN
1000.0000 mg | Freq: Two times a day (BID) | INTRAVENOUS | Status: DC
  Administered 2020-01-27 – 2020-01-28 (×2): 1000 mg via INTRAVENOUS
  Filled 2020-01-27 (×2): qty 200

## 2020-01-27 MED ORDER — DEXAMETHASONE SODIUM PHOSPHATE 10 MG/ML IJ SOLN
10.0000 mg | Freq: Once | INTRAMUSCULAR | Status: AC
  Administered 2020-01-27: 10 mg via INTRAVENOUS
  Filled 2020-01-27: qty 1

## 2020-01-27 MED ORDER — LEVALBUTEROL HCL 1.25 MG/0.5ML IN NEBU: 1.2500 mg | INHALATION_SOLUTION | Freq: Four times a day (QID) | RESPIRATORY_TRACT | Status: DC | PRN

## 2020-01-27 MED ORDER — ONDANSETRON HCL 4 MG/2ML IJ SOLN
4.0000 mg | Freq: Once | INTRAMUSCULAR | Status: AC
  Administered 2020-01-27: 4 mg via INTRAVENOUS
  Filled 2020-01-27: qty 2

## 2020-01-27 MED ORDER — HYDROMORPHONE HCL 1 MG/ML IJ SOLN
0.5000 mg | Freq: Once | INTRAMUSCULAR | Status: AC
  Administered 2020-01-27: 0.5 mg via INTRAVENOUS
  Filled 2020-01-27: qty 1

## 2020-01-27 MED ORDER — ACETAMINOPHEN 325 MG PO TABS: 650.0000 mg | ORAL_TABLET | Freq: Four times a day (QID) | ORAL | Status: DC | PRN

## 2020-01-27 MED ORDER — CLINDAMYCIN PHOSPHATE 600 MG/50ML IV SOLN: 600.0000 mg | Freq: Three times a day (TID) | INTRAVENOUS | Status: DC

## 2020-01-27 MED ORDER — THIAMINE HCL 100 MG PO TABS
100.0000 mg | ORAL_TABLET | Freq: Every day | ORAL | Status: DC
  Administered 2020-01-28 – 2020-01-29 (×2): 100 mg via ORAL
  Filled 2020-01-27 (×2): qty 1

## 2020-01-27 MED ORDER — FENTANYL CITRATE (PF) 100 MCG/2ML IJ SOLN
12.5000 ug | INTRAMUSCULAR | Status: DC | PRN
  Administered 2020-01-27 – 2020-01-29 (×4): 25 ug via INTRAVENOUS
  Filled 2020-01-27 (×4): qty 2

## 2020-01-27 MED ORDER — ENOXAPARIN SODIUM 40 MG/0.4ML ~~LOC~~ SOLN
40.0000 mg | SUBCUTANEOUS | Status: DC
  Administered 2020-01-27: 40 mg via SUBCUTANEOUS
  Filled 2020-01-27: qty 0.4

## 2020-01-27 MED ORDER — ADULT MULTIVITAMIN W/MINERALS CH
1.0000 | ORAL_TABLET | Freq: Every day | ORAL | Status: DC
  Administered 2020-01-28 – 2020-01-29 (×2): 1 via ORAL
  Filled 2020-01-27 (×2): qty 1

## 2020-01-27 MED ORDER — THIAMINE HCL 100 MG/ML IJ SOLN: 100.0000 mg | Freq: Every day | INTRAMUSCULAR | Status: DC

## 2020-01-27 MED ORDER — FOLIC ACID 1 MG PO TABS
1.0000 mg | ORAL_TABLET | Freq: Every day | ORAL | Status: DC
  Administered 2020-01-28 – 2020-01-29 (×2): 1 mg via ORAL
  Filled 2020-01-27 (×2): qty 1

## 2020-01-27 MED ORDER — LORAZEPAM 2 MG/ML IJ SOLN: 0.0000 mg | Freq: Three times a day (TID) | INTRAMUSCULAR | Status: DC

## 2020-01-27 MED ORDER — METOPROLOL TARTRATE 5 MG/5ML IV SOLN
7.5000 mg | Freq: Four times a day (QID) | INTRAVENOUS | Status: DC
  Administered 2020-01-27 – 2020-01-28 (×3): 7.5 mg via INTRAVENOUS
  Filled 2020-01-27 (×3): qty 10

## 2020-01-27 MED ORDER — VANCOMYCIN HCL IN DEXTROSE 1-5 GM/200ML-% IV SOLN
1000.0000 mg | Freq: Once | INTRAVENOUS | Status: AC
  Administered 2020-01-27: 1000 mg via INTRAVENOUS
  Filled 2020-01-27: qty 200

## 2020-01-27 MED ORDER — IOHEXOL 300 MG/ML  SOLN
75.0000 mL | Freq: Once | INTRAMUSCULAR | Status: AC | PRN
  Administered 2020-01-27: 75 mL via INTRAVENOUS

## 2020-01-27 MED ORDER — LORAZEPAM 1 MG PO TABS
1.0000 mg | ORAL_TABLET | ORAL | Status: DC | PRN
  Administered 2020-01-28: 1 mg via ORAL
  Filled 2020-01-27: qty 1

## 2020-01-27 MED ORDER — DEXTROSE-NACL 5-0.9 % IV SOLN: INTRAVENOUS | Status: DC

## 2020-01-27 MED ORDER — METHOCARBAMOL 1000 MG/10ML IJ SOLN
500.0000 mg | Freq: Four times a day (QID) | INTRAVENOUS | Status: DC | PRN
  Filled 2020-01-27: qty 5

## 2020-01-27 MED ORDER — PIPERACILLIN-TAZOBACTAM 3.375 G IVPB 30 MIN
3.3750 g | Freq: Once | INTRAVENOUS | Status: AC
  Administered 2020-01-27: 3.375 g via INTRAVENOUS
  Filled 2020-01-27: qty 50

## 2020-01-27 MED ORDER — HYDRALAZINE HCL 20 MG/ML IJ SOLN: 5.0000 mg | Freq: Four times a day (QID) | INTRAMUSCULAR | Status: DC | PRN
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
## 2020-01-27 NOTE — ED Notes (Signed)
[facility] here for transport of pt to [facility]

## 2020-01-27 NOTE — Progress Notes (Signed)
Pharmacy Antibiotic Note  Mark Crosby is a 64 y.o. male admitted on 01/27/2020 with cellulitis.  Pharmacy has been consulted for Vancomycin & Zosyn dosing.  Plan: Zosyn 3.375gm IV Q8h to be infused over 4hrs Vancomycin 1gm IV q12h to target trough ~15 Monitor renal function and cx data  Daily Scr while on Vanc & Zosyn  Height: 5\' 8"  (172.7 cm) Weight: 72.6 kg (160 lb) IBW/kg (Calculated) : 68.4  Temp (24hrs), Avg:98.4 F (36.9 C), Min:98.4 F (36.9 C), Max:98.4 F (36.9 C)  Recent Labs  Lab 01/27/20 1017 01/27/20 1018 01/27/20 1215  WBC 20.8*  --   --   CREATININE 1.08  --   --   LATICACIDVEN  --  1.7 1.5    Estimated Creatinine Clearance: 66.9 mL/min (by C-G formula based on SCr of 1.08 mg/dL).    Allergies  Allergen Reactions  . Percocet [Oxycodone-Acetaminophen] Itching    Itching with large dose of medication  . Tramadol Other (See Comments)    Per pt, "makes stomach hurt"    Antimicrobials this admission: 6/12 Clindamycin >>  6/12 Zosyn >>  6/12 Vancomycin>>  Dose adjustments this admission:  Microbiology results: 6/12 BCx:   Thank you for allowing pharmacy to be a part of this patient's care.  Netta Cedars PharmD, BCPS 01/27/2020 2:01 PM

## 2020-01-27 NOTE — ED Notes (Signed)
I have just called report to Justin Mend, RN at [facility]. I will call [facility] for transport now.
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
## 2020-01-27 NOTE — ED Notes (Signed)
Patient transported to CT 

## 2020-01-27 NOTE — ED Triage Notes (Signed)
Patient reports that he had a study done Wednesday and had IV contrast. Patient states he has had tongue and neck swelling since. Patient is able to talk and swallow.

## 2020-01-27 NOTE — ED Provider Notes (Signed)
64 yo male presents today with submandibular swelling and pain for 5 days.  Increasing difficulty swallowing. HR 109 Mouth with swelling bilaterally floor with soft tissue swelling and exudate Submandibular firm tender swelling  Patient treated with clinda ENT and CT pending   Pattricia Boss, MD 01/27/20 1206

## 2020-01-28 MED ORDER — HYDROCODONE-ACETAMINOPHEN 5-325 MG PO TABS
1.0000 | ORAL_TABLET | Freq: Four times a day (QID) | ORAL | Status: DC | PRN
  Administered 2020-01-28 – 2020-01-29 (×2): 1 via ORAL
  Filled 2020-01-28 (×2): qty 1

## 2020-01-28 NOTE — Progress Notes (Signed)
Report received from shift RN at bedside. Agree with previously charted assessment.

## 2020-01-28 NOTE — Progress Notes (Signed)
PROGRESS NOTE  Mark Crosby KNL:[id number] DOB: 1956/07/30 DOA: 01/27/2020 PCP: Penni Bombard, PA   LOS: 1 day   Brief Narrative / Interim history: 64 year old male with history of adjustment disorder with anxiety/depression, intermittent EtOH, cannabis, tobacco, COPD, came into the hospital with submandibular swelling and voice change.  Symptoms started on Monday initially felt pain and swelling of the left side of his jaw and progressively has moved towards the right side, he also has noted pain with swallowing.  No breathing difficulties.  CT scan on admission showed deep space infection with abscess crossing the floor of the mouth with asymmetrical pharyngeal swelling and possible right tonsillar ulceration.  Dr. Wilburn Cornelia with ENT was consulted and patient was admitted from [facility] to [facility] / 24h Interval events: Has persistent symptoms this morning, however feels like since initiation of antibiotics he moves his thumb but little bit more.  No fever or chills overnight.  Assessment & Plan: Principal Problem Soft tissue abscess/infection-ENT consult appreciated, discussed with Dr. Wilburn Cornelia over the phone, he feels like this is an advantage" given the rates not that apparent on the CT scan.  It appears that since it is improving on its own with some spontaneous drainage he recommends monitoring 1 hour intravenous antibiotics for 24 additional hours and consider transitioning to oral clindamycin tomorrow for total of 10 days if he continues to improve.  Active Problems Essential hypertension-does not appear to be on home medications, blood pressure fairly acceptable monitor for now  History of EtOH use-continue CIWA  COPD-no wheezing, this is stable  Scheduled Meds: . folic acid  1 mg Oral Daily  . heparin injection (subcutaneous)  5,000 Units Subcutaneous Q8H  . LORazepam  0-4 mg Intravenous Q4H   Followed by  . [START ON 01/29/2020] LORazepam  0-4 mg  Intravenous Q8H  . multivitamin with minerals  1 tablet Oral Daily  . nicotine  21 mg Transdermal Daily  . thiamine  100 mg Oral Daily   Or  . thiamine  100 mg Intravenous Daily   Continuous Infusions: . sodium chloride 250 mL (01/28/20 0555)  . piperacillin-tazobactam (ZOSYN)  IV 3.375 g (01/28/20 0556)   PRN Meds:.sodium chloride, acetaminophen **OR** acetaminophen, fentaNYL (SUBLIMAZE) injection, hydrALAZINE, levalbuterol, LORazepam **OR** LORazepam, methocarbamol (ROBAXIN) IV  DVT prophylaxis: SCDs Code Status: Full code Family Communication: no family at bedside   Status is: Inpatient  Remains inpatient appropriate because:Monitor neck abscess on IV antibiotics for 24 hours per ENT recommendations   Dispo: The patient is from: Home              Anticipated d/c is to: Home              Anticipated d/c date is: 1 day              Patient currently is not medically stable to d/c.  Consultants:  ENT  Procedures:  None   Microbiology  none  Antimicrobials: Vancomycin 6/12 >> 6/13 Zosyn 6/12 >>    Objective: Vitals:   01/27/20 2349 01/28/20 0324 01/28/20 0611 01/28/20 0732  BP: 122/75 137/85 (!) 143/77 (!) 145/99  Pulse: 90 87 79 88  Resp: (!) 21 20    Temp: 98.6 F (37 C) 98.4 F (36.9 C)  97.8 F (36.6 C)  TempSrc:  Oral    SpO2: 96% 98%  96%  Weight:  69 kg    Height:        Intake/Output Summary (Last 24  hours) at 01/28/2020 0939 Last data filed at 01/28/2020 0500 Gross per 24 hour  Intake 1172.06 ml  Output --  Net 1172.06 ml   Filed Weights   01/27/20 1005 01/27/20 2146 01/28/20 0324  Weight: 72.6 kg 69.3 kg 69 kg    Examination:  Constitutional: NAD Eyes: no scleral icterus Respiratory: clear to auscultation bilaterally, no wheezing, no crackles. Cardiovascular: Regular rate and rhythm, no murmurs / rubs / gallops.  Abdomen: non distended, no tenderness. Bowel sounds positive.  Musculoskeletal: no clubbing / cyanosis.  Skin: no  rashes Neurologic: non focal   Data Reviewed: I have independently reviewed following labs and imaging studies   CBC: Recent Labs  Lab 01/27/20 1017  WBC 20.8*  NEUTROABS 17.0*  HGB 14.1  HCT 44.7  MCV 102.1*  PLT 196   Basic Metabolic Panel: Recent Labs  Lab 01/27/20 1017  NA 142  K 3.8  CL 107  CO2 24  GLUCOSE 94  BUN 13  CREATININE 1.08  CALCIUM 8.7*   Liver Function Tests: No results for input(s): AST, ALT, ALKPHOS, BILITOT, PROT, ALBUMIN in the last 168 hours. Coagulation Profile: No results for input(s): INR, PROTIME in the last 168 hours. HbA1C: No results for input(s): HGBA1C in the last 72 hours. CBG: No results for input(s): GLUCAP in the last 168 hours.  Recent Results (from the past 240 hour(s))  Culture, blood (single)     Status: None (Preliminary result)   Collection Time: 01/27/20 10:18 AM   Specimen: BLOOD  Result Value Ref Range Status   Specimen Description   Final    BLOOD BLOOD RIGHT FOREARM Performed at [facility] [address], [place], [place] [address]    Special Requests   Final    BOTTLES DRAWN AEROBIC AND ANAEROBIC Blood Culture results may not be optimal due to an inadequate volume of blood received in culture bottles Performed at [facility] [address], [place], [place] [address]    Culture   Final    NO GROWTH < 24 HOURS Performed at [facility] [address] [place], [place] [address]    Report Status PENDING  Incomplete  SARS Coronavirus 2 by RT PCR (hospital order, performed in [facility] hospital lab) Nasopharyngeal Nasopharyngeal Swab     Status: None   Collection Time: 01/27/20 12:15 PM   Specimen: Nasopharyngeal Swab  Result Value Ref Range Status   SARS Coronavirus 2 NEGATIVE NEGATIVE Final    Comment: (NOTE) SARS-CoV-2 target nucleic acids are NOT DETECTED.  The SARS-CoV-2 RNA is generally detectable in upper and lower respiratory specimens during the  acute phase of infection. The lowest concentration of SARS-CoV-2 viral copies this assay can detect is 250 copies / mL. A negative result does not preclude SARS-CoV-2 infection and should not be used as the sole basis for treatment or other patient management decisions.  A negative result may occur with improper specimen collection / handling, submission of specimen other than nasopharyngeal swab, presence of viral mutation(s) within the areas targeted by this assay, and inadequate number of viral copies (<250 copies / mL). A negative result must be combined with clinical observations, patient history, and epidemiological information.  Fact Sheet for Patients:   StrictlyIdeas.no  Fact Sheet for Healthcare Providers: BankingDealers.co.za  This test is not yet approved or  cleared by the [place] FDA and has been authorized for detection and/or diagnosis of SARS-CoV-2 by FDA under an Emergency Use Authorization (EUA).  This EUA will remain in effect (meaning this test can be used) for the duration of the COVID-19 declaration under Section 564(b)(1) of the Act, 21 U.S.C. section 360bbb-3(b)(1), unless the authorization is terminated or revoked sooner.  Performed at [facility], [place] [address], [place], [place] [address]   Culture, blood (routine x 2)     Status: None (Preliminary result)   Collection Time: 01/27/20  2:45 PM   Specimen: BLOOD LEFT HAND  Result Value Ref Range Status   Specimen Description   Final    BLOOD LEFT HAND Performed at [facility] [address], [place], [place] [address]    Special Requests   Final    BOTTLES DRAWN AEROBIC AND ANAEROBIC Blood Culture results may not be optimal due to an excessive volume of blood received in culture bottles Performed at [facility] [address], [place], [place] [address]    Culture   Final    NO GROWTH <  12 HOURS Performed at [facility] [address], [place], [place] [address]    Report Status PENDING  Incomplete     Radiology Studies: CT Soft Tissue Neck W Contrast  Result Date: 01/27/2020 CLINICAL DATA:  Sublingual/submandibular abscess EXAM: CT NECK WITH CONTRAST TECHNIQUE: Multidetector CT imaging of the neck was performed using the standard protocol following the bolus administration of intravenous contrast. CONTRAST:  76mL OMNIPAQUE IOHEXOL 300 MG/ML  SOLN COMPARISON:  None. FINDINGS: Pharynx and larynx: Asymmetric swelling centered at the right tonsillar fossa with submucosal edema extending into the hypopharynx. Mucosal enhancement is poor but there appears may be a track extending towards the retromolar trigone and than curving along floor of mouth, seen on both sides of the mylohyoid. This abscess measures up to 9 mm in thickness on coronal reformats and is essentially involves the entire transverse span of the floor of mouth. Salivary glands: Secondary inflammation of the submandibular glands. No primary salivary disease is noted. Thyroid: Negative Lymph nodes: No worrisome nodal enlargement. Vascular: Scattered atheromatous changes. Limited intracranial: Negative Visualized orbits: Limited coverage is negative Mastoids and visualized paranasal sinuses: Clear Skeleton: Cervical disc and facet degeneration with reversal of lordosis and C2-3 facet ankylosis. No definite odontogenic source. Upper chest: Negative IMPRESSION: Deep space infection with abscess crossing the floor of mouth. The epicenter appears to be on the right where there is asymmetric pharyngeal swelling and possible right tonsillar ulceration. Electronically Signed   By: Monte Fantasia M.D.   On: 01/27/2020 13:01    Marzetta Board, MD, PhD [facility]  Between 7 am - 7 pm I am available, please contact me via Amion or Securechat  Between 7 pm - 7 am I am not available, please contact night coverage MD/APP  via Amion

## 2020-01-28 NOTE — Progress Notes (Signed)
Patient tolerated soft diet well. Will increase to regular per MD order.
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
## 2020-01-28 NOTE — Progress Notes (Signed)
Warm saline mouth rinse given to patient.

## 2020-01-29 LAB — COMPREHENSIVE METABOLIC PANEL
ALT: 13 U/L (ref 0–44)
AST: 18 U/L (ref 15–41)
Albumin: 2.7 g/dL — ABNORMAL LOW (ref 3.5–5.0)
Alkaline Phosphatase: 69 U/L (ref 38–126)
Anion gap: 9 (ref 5–15)
BUN: 16 mg/dL (ref 8–23)
CO2: 23 mmol/L (ref 22–32)
Calcium: 8.6 mg/dL — ABNORMAL LOW (ref 8.9–10.3)
Chloride: 106 mmol/L (ref 98–111)
Creatinine, Ser: 0.92 mg/dL (ref 0.61–1.24)
GFR calc Af Amer: >60 mL/min (ref >60)
GFR calc non Af Amer: >60 mL/min (ref >60)
Glucose, Bld: 106 mg/dL — ABNORMAL HIGH (ref 70–99)
Potassium: 3.3 mmol/L — ABNORMAL LOW (ref 3.5–5.1)
Sodium: 138 mmol/L (ref 135–145)
Total Bilirubin: 0.7 mg/dL (ref 0.3–1.2)
Total Protein: 6 g/dL — ABNORMAL LOW (ref 6.5–8.1)

## 2020-01-29 LAB — CBC
HCT: 36.5 % — ABNORMAL LOW (ref 39.0–52.0)
Hemoglobin: 12.1 g/dL — ABNORMAL LOW (ref 13.0–17.0)
MCH: 31.8 pg (ref 26.0–34.0)
MCHC: 33.2 g/dL (ref 30.0–36.0)
MCV: 96.1 fL (ref 80.0–100.0)
Platelets: 342 10*3/uL (ref 150–400)
RBC: 3.8 MIL/uL — ABNORMAL LOW (ref 4.22–5.81)
RDW: 11.8 % (ref 11.5–15.5)
WBC: 18.4 10*3/uL — ABNORMAL HIGH (ref 4.0–10.5)
nRBC: 0 % (ref 0.0–0.2)

## 2020-01-29 LAB — PHOSPHORUS: Phosphorus: 2.5 mg/dL (ref 2.5–4.6)

## 2020-01-29 LAB — MAGNESIUM: Magnesium: 1.9 mg/dL (ref 1.7–2.4)

## 2020-01-29 MED ORDER — CLINDAMYCIN HCL 300 MG PO CAPS
300.0000 mg | ORAL_CAPSULE | Freq: Three times a day (TID) | ORAL | 0 refills | Status: AC
Start: 2020-01-29 — End: 2020-02-08

## 2020-01-29 MED ORDER — POTASSIUM CHLORIDE CRYS ER 20 MEQ PO TBCR
40.0000 meq | EXTENDED_RELEASE_TABLET | Freq: Once | ORAL | Status: AC
  Administered 2020-01-29: 40 meq via ORAL
  Filled 2020-01-29: qty 2

## 2020-01-29 MED ORDER — OXYCODONE HCL 5 MG PO TABS: 5.0000 mg | ORAL_TABLET | Freq: Four times a day (QID) | ORAL | 0 refills | Status: AC | PRN

## 2020-01-29 MED ORDER — OXYCODONE HCL 5 MG PO TABS
5.0000 mg | ORAL_TABLET | ORAL | Status: DC | PRN
  Administered 2020-01-29: 10 mg via ORAL
  Filled 2020-01-29: qty 2

## 2020-01-29 NOTE — Progress Notes (Signed)
Patient was discharged home by MD order; discharged instructions reviewed and given to patient with care notes and prescriptions; IV DIC; skin intact; patient will be escorted to the car by nurse tech via wheelchair.
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
## 2020-01-29 NOTE — Discharge Instructions (Signed)
Follow with Penni Bombard, PA in 5-7 days  Follow up with your dentist in 1 week Follow up with ENT in 1-2 weeks  Please get a complete blood count and chemistry panel checked by your Primary MD at your next visit, and again as instructed by your Primary MD. Please get your medications reviewed and adjusted by your Primary MD.  Please request your Primary MD to go over all Hospital Tests and Procedure/Radiological results at the follow up, please get all Hospital records sent to your Prim MD by signing hospital release before you go home.  In some cases, there will be blood work, cultures and biopsy results pending at the time of your discharge. Please request that your primary care M.D. goes through all the records of your hospital data and follows up on these results.  If you had Pneumonia of Lung problems at the Hospital: Please get a 2 view Chest X ray done in 6-8 weeks after hospital discharge or sooner if instructed by your Primary MD.  If you have Congestive Heart Failure: Please call your Cardiologist or Primary MD anytime you have any of the following symptoms:  1) 3 pound weight gain in 24 hours or 5 pounds in 1 week  2) shortness of breath, with or without a dry hacking cough  3) swelling in the hands, feet or stomach  4) if you have to sleep on extra pillows at night in order to breathe  Follow cardiac low salt diet and 1.5 lit/day fluid restriction.  If you have diabetes Accuchecks 4 times/day, Once in AM empty stomach and then before each meal. Log in all results and show them to your primary doctor at your next visit. If any glucose reading is under 80 or above 300 call your primary MD immediately.  If you have Seizure/Convulsions/Epilepsy: Please do not drive, operate heavy machinery, participate in activities at heights or participate in high speed sports until you have seen by Primary MD or a Neurologist and advised to do so again. Per [facility] statutes,  patients with seizures are not allowed to drive until they have been seizure-free for six months.  Use caution when using heavy equipment or power tools. Avoid working on ladders or at heights. Take showers instead of baths. Ensure the water temperature is not too high on the home water heater. Do not go swimming alone. Do not lock yourself in a room alone (i.e. bathroom). When caring for infants or small children, sit down when holding, feeding, or changing them to minimize risk of injury to the child in the event you have a seizure. Maintain good sleep hygiene. Avoid alcohol.   If you had Gastrointestinal Bleeding: Please ask your Primary MD to check a complete blood count within one week of discharge or at your next visit. Your endoscopic/colonoscopic biopsies that are pending at the time of discharge, will also need to followed by your Primary MD.  Get Medicines reviewed and adjusted. Please take all your medications with you for your next visit with your Primary MD  Please request your Primary MD to go over all hospital tests and procedure/radiological results at the follow up, please ask your Primary MD to get all Hospital records sent to his/her office.  If you experience worsening of your admission symptoms, develop shortness of breath, life threatening emergency, suicidal or homicidal thoughts you must seek medical attention immediately by calling 911 or calling your MD immediately  if symptoms less severe.  You must  read complete instructions/literature along with all the possible adverse reactions/side effects for all the Medicines you take and that have been prescribed to you. Take any new Medicines after you have completely understood and accpet all the possible adverse reactions/side effects.   Do not drive or operate heavy machinery when taking Pain medications.   Do not take more than prescribed Pain, Sleep and Anxiety Medications  Special Instructions: If you have smoked or chewed  Tobacco  in the last 2 yrs please stop smoking, stop any regular Alcohol  and or any Recreational drug use.  Wear Seat belts while driving.  Please note You were cared for by a hospitalist during your hospital stay. If you have any questions about your discharge medications or the care you received while you were in the hospital after you are discharged, you can call the unit and asked to speak with the hospitalist on call if the hospitalist that took care of you is not available. Once you are discharged, your primary care physician will handle any further medical issues. Please note that NO REFILLS for any discharge medications will be authorized once you are discharged, as it is imperative that you return to your primary care physician (or establish a relationship with a primary care physician if you do not have one) for your aftercare needs so that they can reassess your need for medications and monitor your lab values.  You can reach the hospitalist office at phone [phone] or fax [phone]   If you do not have a primary care physician, you can call [phone] for a physician referral.  Activity: As tolerated with Full fall precautions use walker/cane & assistance as needed    Diet: soft diet  Disposition Home

## 2020-02-01 LAB — CULTURE, BLOOD (SINGLE): Culture: NO GROWTH

## 2020-02-01 LAB — CULTURE, BLOOD (ROUTINE X 2): Culture: NO GROWTH

## 2022-03-16 ENCOUNTER — Other Ambulatory Visit: Payer: Self-pay

## 2022-03-16 ENCOUNTER — Emergency Department (HOSPITAL_COMMUNITY): Payer: Medicare Other

## 2022-03-16 ENCOUNTER — Encounter (HOSPITAL_COMMUNITY): Payer: Self-pay | Admitting: Emergency Medicine

## 2022-03-16 ENCOUNTER — Emergency Department (HOSPITAL_COMMUNITY)
Admission: EM | Admit: 2022-03-16 | Discharge: 2022-03-16 | Discharge status: Against Medical Advice | Payer: Medicare Other | Attending: Emergency Medicine | Admitting: Emergency Medicine

## 2022-03-16 DIAGNOSIS — M79602 Pain in left arm: Secondary | ICD-10-CM | POA: Diagnosis not present

## 2022-03-16 DIAGNOSIS — R0789 Other chest pain: Secondary | ICD-10-CM | POA: Insufficient documentation

## 2022-03-16 DIAGNOSIS — Z5321 Procedure and treatment not carried out due to patient leaving prior to being seen by health care provider: Secondary | ICD-10-CM | POA: Insufficient documentation

## 2022-03-16 LAB — BASIC METABOLIC PANEL
Anion gap: 7 (ref 5–15)
BUN: 12 mg/dL (ref 8–23)
CO2: 23 mmol/L (ref 22–32)
Calcium: 9.2 mg/dL (ref 8.9–10.3)
Chloride: 107 mmol/L (ref 98–111)
Creatinine, Ser: 0.98 mg/dL (ref 0.61–1.24)
GFR, Estimated: >60 mL/min (ref >60)
Glucose, Bld: 72 mg/dL (ref 70–99)
Potassium: 3.6 mmol/L (ref 3.5–5.1)
Sodium: 137 mmol/L (ref 135–145)

## 2022-03-16 LAB — CBC
HCT: 43.7 % (ref 39.0–52.0)
Hemoglobin: 14.1 g/dL (ref 13.0–17.0)
MCH: 32.3 pg (ref 26.0–34.0)
MCHC: 32.3 g/dL (ref 30.0–36.0)
MCV: 100 fL (ref 80.0–100.0)
Platelets: 320 10*3/uL (ref 150–400)
RBC: 4.37 MIL/uL (ref 4.22–5.81)
RDW: 12.7 % (ref 11.5–15.5)
WBC: 11.3 10*3/uL — ABNORMAL HIGH (ref 4.0–10.5)
nRBC: 0 % (ref 0.0–0.2)

## 2022-03-16 LAB — TROPONIN I (HIGH SENSITIVITY): Troponin I (High Sensitivity): 5 ng/L (ref <18)

## 2022-03-16 NOTE — ED Provider Triage Note (Signed)
Emergency Medicine Provider Triage Evaluation Note  ISREAL MOLINE , a 66 y.o. male  was evaluated in triage.  Pt complains of chest pain, and left arm pain.  States pain radiated up left arm and is now on the left side of chest.  Denies fever, productive cough, or other complaints.  Review of Systems  Positive: As above Negative: As above  Physical Exam  BP 134/80 (BP Location: Left Arm)   Pulse 76   Temp 98.5 F (36.9 C) (Oral)   Resp 20   Ht '5\' 8"'$  (1.727 m)   Wt 72.6 kg   SpO2 96%   BMI 24.33 kg/m  Gen:   Awake, no distress   Resp:  Normal effort  MSK:   Moves extremities without difficulty  Other:    Medical Decision Making  Medically screening exam initiated at 2:50 PM.  Appropriate orders placed.  LAINE GIOVANETTI was informed that the remainder of the evaluation will be completed by another provider, this initial triage assessment does not replace that evaluation, and the importance of remaining in the ED until their evaluation is complete.     Evlyn Courier, PA-C 03/16/22 1451

## 2022-03-16 NOTE — ED Notes (Signed)
Pt states he passed out last night after his cp started. It was a brief episode. Pt states he has felt dizzy off and on since this morning.

## 2022-03-16 NOTE — ED Triage Notes (Signed)
Onset last pm  chest pain with some left arm pain

## 2022-03-16 NOTE — ED Notes (Signed)
Pt said that he could no longer wait he is going home.

## 2022-03-16 NOTE — ED Notes (Signed)
Refused repeat labwork

## 2024-05-09 ENCOUNTER — Other Ambulatory Visit: Payer: Self-pay

## 2024-05-09 ENCOUNTER — Emergency Department (HOSPITAL_COMMUNITY)

## 2024-05-09 ENCOUNTER — Emergency Department (HOSPITAL_COMMUNITY)
Admission: EM | Admit: 2024-05-09 | Discharge: 2024-05-09 | Disposition: A | Discharge status: Home / Self Care | Attending: Emergency Medicine | Admitting: Emergency Medicine

## 2024-05-09 ENCOUNTER — Encounter (HOSPITAL_COMMUNITY): Payer: Self-pay

## 2024-05-09 DIAGNOSIS — F17200 Nicotine dependence, unspecified, uncomplicated: Secondary | ICD-10-CM | POA: Insufficient documentation

## 2024-05-09 DIAGNOSIS — J449 Chronic obstructive pulmonary disease, unspecified: Secondary | ICD-10-CM | POA: Insufficient documentation

## 2024-05-09 DIAGNOSIS — R0981 Nasal congestion: Secondary | ICD-10-CM | POA: Diagnosis present

## 2024-05-09 DIAGNOSIS — J069 Acute upper respiratory infection, unspecified: Secondary | ICD-10-CM | POA: Diagnosis not present

## 2024-05-09 LAB — BASIC METABOLIC PANEL WITH GFR
Anion gap: 15 (ref 5–15)
BUN: 15 mg/dL (ref 8–23)
CO2: 24 mmol/L (ref 22–32)
Calcium: 9.9 mg/dL (ref 8.9–10.3)
Chloride: 101 mmol/L (ref 98–111)
Creatinine, Ser: 0.97 mg/dL (ref 0.61–1.24)
GFR, Estimated: >60 mL/min (ref >60)
Glucose, Bld: 113 mg/dL — ABNORMAL HIGH (ref 70–99)
Potassium: 4.1 mmol/L (ref 3.5–5.1)
Sodium: 139 mmol/L (ref 135–145)

## 2024-05-09 LAB — TROPONIN T, HIGH SENSITIVITY: Troponin T High Sensitivity: <15 ng/L (ref 0–19)

## 2024-05-09 LAB — RESP PANEL BY RT-PCR (RSV, FLU A&B, COVID)  RVPGX2
Influenza A by PCR: NEGATIVE
Influenza B by PCR: NEGATIVE
Resp Syncytial Virus by PCR: NEGATIVE
SARS Coronavirus 2 by RT PCR: NEGATIVE

## 2024-05-09 LAB — CBC
HCT: 52.1 % — ABNORMAL HIGH (ref 39.0–52.0)
Hemoglobin: 16.4 g/dL (ref 13.0–17.0)
MCH: 30.9 pg (ref 26.0–34.0)
MCHC: 31.5 g/dL (ref 30.0–36.0)
MCV: 98.1 fL (ref 80.0–100.0)
Platelets: 313 K/uL (ref 150–400)
RBC: 5.31 MIL/uL (ref 4.22–5.81)
RDW: 12.5 % (ref 11.5–15.5)
WBC: 7.5 K/uL (ref 4.0–10.5)
nRBC: 0 % (ref 0.0–0.2)

## 2024-05-09 LAB — GROUP A STREP BY PCR: Group A Strep by PCR: NOT DETECTED

## 2024-05-09 MED ORDER — BENZONATATE 100 MG PO CAPS: 100.0000 mg | ORAL_CAPSULE | Freq: Three times a day (TID) | ORAL | 0 refills | Status: AC

## 2024-05-09 MED ORDER — GUAIFENESIN ER 1200 MG PO TB12: 1.0000 | ORAL_TABLET | Freq: Two times a day (BID) | ORAL | 0 refills | Status: AC

## 2024-05-09 NOTE — ED Provider Notes (Signed)
 [place] EMERGENCY DEPARTMENT AT [facility] Provider Note   CSN: [id number] Arrival date & time: 05/09/24  1052     Patient presents with: Chest Pain   Mark Crosby is a 68 y.o. male.    Chest Pain    Patient has a history of COPD diverticulosis bronchitis.  Patient states he still occasionally smokes but not regularly.  Patient states in the last day or 2 he started having trouble with cough and congestion.  He has been bringing up yellow-green sputum.  Patient started having trouble with headache as well as pain in his chest going to his back.  Increases with breathing and coughing.  Did not measure his temperature but he was worried he had a fever last night.  Prior to Admission medications   Medication Sig Start Date End Date Taking? Authorizing Provider  benzonatate  (TESSALON ) 100 MG capsule Take 1 capsule (100 mg total) by mouth every 8 (eight) hours. 05/09/24  Yes Randol Simmonds, MD  Guaifenesin  1200 MG TB12 Take 1 tablet (1,200 mg total) by mouth 2 (two) times daily at 10 AM and 5 PM. 05/09/24  Yes Randol Simmonds, MD  docusate sodium  (COLACE) 100 MG capsule Take 1 capsule (100 mg total) by mouth 2 (two) times daily. While taking narcotic pain medicine. Patient not taking: Reported on 01/27/2020 10/04/17   Aniceto Eva Grebe, PA-C  ibuprofen  (ADVIL ) 200 MG tablet Take 200 mg by mouth every 6 (six) hours as needed.    [provider]  senna (SENOKOT) 8.6 MG TABS tablet Take 2 tablets (17.2 mg total) by mouth 2 (two) times daily. Patient not taking: Reported on 01/27/2020 10/04/17   Aniceto Eva Grebe, PA-C  Vitamin D, Ergocalciferol, (DRISDOL) 1.25 MG (50000 UNIT) CAPS capsule Take 50,000 Units by mouth once a week. On Mondays    [provider]    Allergies: Percocet [oxycodone -acetaminophen ] and Tramadol    Review of Systems  Cardiovascular:  Positive for chest pain.    Updated Vital Signs BP (!) 113/94 (BP Location: Left Arm)   Pulse (!) 116    Temp 98.6 F (37 C) (Oral)   Resp 19   SpO2 97%   Physical Exam Vitals and nursing note reviewed.  Constitutional:      General: He is not in acute distress.    Appearance: He is well-developed.  HENT:     Head: Normocephalic and atraumatic.     Right Ear: External ear normal.     Left Ear: External ear normal.  Eyes:     General: No scleral icterus.       Right eye: No discharge.        Left eye: No discharge.     Conjunctiva/sclera: Conjunctivae normal.  Neck:     Trachea: No tracheal deviation.  Cardiovascular:     Rate and Rhythm: Normal rate and regular rhythm.  Pulmonary:     Effort: Pulmonary effort is normal. No respiratory distress.     Breath sounds: Normal breath sounds. No stridor. No wheezing or rales.  Abdominal:     General: Bowel sounds are normal. There is no distension.     Palpations: Abdomen is soft.     Tenderness: There is no abdominal tenderness. There is no guarding or rebound.  Musculoskeletal:        General: No tenderness or deformity.     Cervical back: Neck supple.  Skin:    General: Skin is warm and dry.  Findings: No rash.  Neurological:     General: No focal deficit present.     Mental Status: He is alert.     Cranial Nerves: No cranial nerve deficit, dysarthria or facial asymmetry.     Sensory: No sensory deficit.     Motor: No abnormal muscle tone or seizure activity.     Coordination: Coordination normal.  Psychiatric:        Mood and Affect: Mood normal.     (all labs ordered are listed, but only abnormal results are displayed) Labs Reviewed  BASIC METABOLIC PANEL WITH GFR - Abnormal; Notable for the following components:      Result Value   Glucose, Bld 113 (*)    All other components within normal limits  CBC - Abnormal; Notable for the following components:   HCT 52.1 (*)    All other components within normal limits  RESP PANEL BY RT-PCR (RSV, FLU A&B, COVID)  [id number]  GROUP A STREP BY PCR  TROPONIN T, HIGH  SENSITIVITY    EKG: EKG Interpretation Date/Time:  Tuesday May 09 2024 11:16:30 EDT Ventricular Rate:  112 PR Interval:  123 QRS Duration:  77 QT Interval:  320 QTC Calculation: 437 R Axis:   78  Text Interpretation: Sinus tachycardia Biatrial enlargement Borderline repolarization abnormality Since last tracing rate faster Confirmed by Randol Simmonds [id number]) on 05/09/2024 4:15:59 PM  Radiology: [facility] Chest 2 View Result Date: 05/09/2024 EXAM: 2 VIEW(S) XRAY OF THE CHEST 05/09/2024 11:20:00 AM COMPARISON: None available. CLINICAL HISTORY: Chest pain. Per chart - Pt reports with headache, chest pain that goes to his back, and a cough with yellow/green sputum x 2 days FINDINGS: LUNGS AND PLEURA: No focal pulmonary opacity. No pulmonary edema. No pleural effusion. No pneumothorax. HEART AND MEDIASTINUM: No acute abnormality of the cardiac and mediastinal silhouettes. BONES AND SOFT TISSUES: Thoracic degenerative changes. No acute osseous abnormality. IMPRESSION: 1. No acute process. Electronically signed by: Dorethia Molt MD 05/09/2024 11:50 AM EDT RP Workstation: [id number]     Procedures   Medications Ordered in the ED - No data to display                                  Medical Decision Making Amount and/or Complexity of Data Reviewed Labs: ordered. Radiology: ordered.  Risk OTC drugs. Prescription drug management.   Patient presented with cough URI type symptoms.  On exam I do not hear any wheezing.  He is breathing easily speaking in full sentences.  Patient does not have an oxygen requirement.  ED workup does not show any evidence of anemia.  No electrolyte abnormalities.  No signs of severe dehydration.  Patient had COVID flu RSV test that was negative.  He had a troponin that does not show signs of cardiac injury.  Chest x-ray does not show pneumonia.  I suspect his symptoms are related to a viral illness.  Will discharge home with course of Tessalon  and guaifenesin .   Return precautions discussed     Final diagnoses:  Upper respiratory tract infection, unspecified type    ED Discharge Orders          Ordered    benzonatate  (TESSALON ) 100 MG capsule  Every 8 hours        05/09/24 1632    Guaifenesin  1200 MG TB12  2 times daily        05/09/24 1632  Randol Simmonds, MD 05/09/24 [phone]

## 2024-05-09 NOTE — ED Triage Notes (Signed)
 Pt reports with headache, chest pain that goes to his back, and a cough with yellow/green sputum x 2 days.

## 2024-05-09 NOTE — Discharge Instructions (Signed)
 The medications to help with your cough and congestion.  You can also take over-the-counter Tylenol  for aches and pains.  Drink plenty fluids rest.  Follow-up with your doctor next week to be rechecked.  Return to the ER for worsening symptoms including shortness of breath, high fevers, vomiting or other concerns
